# Patient Record
Sex: Female | Born: 1970 | Race: White | Hispanic: No | Marital: Single | Smoking: Current every day smoker
Health system: Southern US, Community
[De-identification: ages and names within clinical notes are randomized; demographics above are authoritative.]

## PROBLEM LIST (undated history)

## (undated) DIAGNOSIS — F209 Schizophrenia, unspecified: Secondary | ICD-10-CM

---

## 2013-12-11 ENCOUNTER — Encounter (HOSPITAL_COMMUNITY): Payer: Self-pay | Admitting: Emergency Medicine

## 2013-12-11 ENCOUNTER — Emergency Department (HOSPITAL_COMMUNITY)
Admission: EM | Admit: 2013-12-11 | Discharge: 2013-12-11 | Disposition: A | Discharge status: Home / Self Care | Payer: Medicaid Other | Attending: Emergency Medicine | Admitting: Emergency Medicine

## 2013-12-11 ENCOUNTER — Emergency Department (HOSPITAL_COMMUNITY): Payer: Medicaid Other

## 2013-12-11 DIAGNOSIS — R319 Hematuria, unspecified: Secondary | ICD-10-CM | POA: Insufficient documentation

## 2013-12-11 DIAGNOSIS — F209 Schizophrenia, unspecified: Secondary | ICD-10-CM | POA: Insufficient documentation

## 2013-12-11 HISTORY — DX: Schizophrenia, unspecified: F20.9

## 2013-12-11 LAB — COMPREHENSIVE METABOLIC PANEL
ALT: 6 U/L (ref 0–35)
AST: 10 U/L (ref 0–37)
Albumin: 3.8 g/dL (ref 3.5–5.2)
Alkaline Phosphatase: 53 U/L (ref 39–117)
BILIRUBIN TOTAL: 0.5 mg/dL (ref 0.3–1.2)
BUN: 9 mg/dL (ref 6–23)
CO2: 24 meq/L (ref 19–32)
Calcium: 9.2 mg/dL (ref 8.4–10.5)
Chloride: 98 mEq/L (ref 96–112)
Creatinine, Ser: 0.71 mg/dL (ref 0.50–1.10)
GFR calc Af Amer: >90 mL/min (ref >90)
GLUCOSE: 106 mg/dL — AB (ref 70–99)
Potassium: 4.3 mEq/L (ref 3.7–5.3)
SODIUM: 134 meq/L — AB (ref 137–147)
Total Protein: 7.5 g/dL (ref 6.0–8.3)

## 2013-12-11 LAB — URINE MICROSCOPIC-ADD ON

## 2013-12-11 LAB — RAPID URINE DRUG SCREEN, HOSP PERFORMED
Amphetamines: NOT DETECTED
Barbiturates: NOT DETECTED
Benzodiazepines: NOT DETECTED
Cocaine: NOT DETECTED
Opiates: NOT DETECTED
TETRAHYDROCANNABINOL: NOT DETECTED

## 2013-12-11 LAB — CBC
HCT: 41.3 % (ref 36.0–46.0)
HEMOGLOBIN: 14.3 g/dL (ref 12.0–15.0)
MCH: 31.1 pg (ref 26.0–34.0)
MCHC: 34.6 g/dL (ref 30.0–36.0)
MCV: 89.8 fL (ref 78.0–100.0)
Platelets: 260 10*3/uL (ref 150–400)
RBC: 4.6 MIL/uL (ref 3.87–5.11)
RDW: 12.4 % (ref 11.5–15.5)
WBC: 9.1 10*3/uL (ref 4.0–10.5)

## 2013-12-11 LAB — URINALYSIS, ROUTINE W REFLEX MICROSCOPIC
Bilirubin Urine: NEGATIVE
Glucose, UA: NEGATIVE mg/dL
Ketones, ur: NEGATIVE mg/dL
LEUKOCYTES UA: NEGATIVE
NITRITE: NEGATIVE
PROTEIN: NEGATIVE mg/dL
SPECIFIC GRAVITY, URINE: 1.021 (ref 1.005–1.030)
UROBILINOGEN UA: 1 mg/dL (ref 0.0–1.0)
pH: 5.5 (ref 5.0–8.0)

## 2013-12-11 LAB — SALICYLATE LEVEL: Salicylate Lvl: <2 mg/dL — ABNORMAL LOW (ref 2.8–20.0)

## 2013-12-11 LAB — ETHANOL: Alcohol, Ethyl (B): <11 mg/dL (ref 0–11)

## 2013-12-11 LAB — ACETAMINOPHEN LEVEL: Acetaminophen (Tylenol), Serum: <15 ug/mL (ref 10–30)

## 2013-12-11 NOTE — Progress Notes (Signed)
Patient cleared by [facility] return to facility. Discharge paperwork sent by transporters.

## 2013-12-11 NOTE — ED Notes (Signed)
Patient refused to let me get EKG

## 2013-12-11 NOTE — ED Notes (Signed)
Pt was sent over from [facility] for medical clearnce pt is IVC and is refusing treatment. Pt has a hx of schizo and not taking medication. Pt said for staff not to touch her or take her blood. Explained to pt that we will need to obtain her blood.

## 2013-12-11 NOTE — Progress Notes (Signed)
All documents required for [facility] to [facility] to [facility] have been faxed to facility. RN awaiting return phone call.

## 2013-12-11 NOTE — ED Provider Notes (Signed)
CSN: [id number]     Arrival date & time 12/11/13  1754 History   First MD Initiated Contact with Patient 12/11/13 1813     Chief Complaint  Patient presents with  . Medical Clearance   (Consider location/radiation/quality/duration/timing/severity/associated sxs/prior Treatment) HPI Comments: 43 year old female with a history of schizophrenia presents from [facility] for medical clearance. The patient was IVC there is his exhibiting psychotic behavior with delusions and disorganized speech. Is also concerned that she's not able to care for self after the psychiatrist talk to the mother. The patient here is refusing to allow an exam and does not cooperate with questioning. She states she's not know why she is here. The report states that she is needing medical clearance push to be treated aggressively for her schizophrenia.   Past Medical History  Diagnosis Date  . Schizophrenia     only known    No past surgical history on file. No family history on file. History  Substance Use Topics  . Smoking status: Not on file  . Smokeless tobacco: Not on file  . Alcohol Use: Not on file   OB History   Grav Para Term Preterm Abortions TAB SAB Ect Mult Living                 Review of Systems  Unable to perform ROS: Psychiatric disorder    Allergies  Review of patient's allergies indicates not on file.  Home Medications  No current outpatient prescriptions on file. BP 121/79  Pulse 73  Temp(Src) 98.5 F (36.9 C) (Oral)  Resp 18  SpO2 99% Physical Exam  Nursing note and vitals reviewed. Constitutional: She appears cachectic.  Odor noted on patient  HENT:  Head: Normocephalic and atraumatic.  Right Ear: External ear normal.  Left Ear: External ear normal.  Nose: Nose normal.  Eyes: Right eye exhibits no discharge. Left eye exhibits no discharge.  Cardiovascular: Normal rate.   Pulmonary/Chest: Effort normal.  Abdominal: She exhibits no distension.  Neurological: She is alert.   Skin: Skin is warm and dry. No rash noted.    ED Course  Procedures (including critical care time) Labs Review Labs Reviewed  COMPREHENSIVE METABOLIC PANEL - Abnormal; Notable for the following:    Sodium 134 (*)    Glucose, Bld 106 (*)    All other components within normal limits  SALICYLATE LEVEL - Abnormal; Notable for the following:    Salicylate Lvl <2.0 (*)    All other components within normal limits  URINALYSIS, ROUTINE W REFLEX MICROSCOPIC - Abnormal; Notable for the following:    APPearance CLOUDY (*)    Hgb urine dipstick LARGE (*)    All other components within normal limits  ACETAMINOPHEN LEVEL  CBC  ETHANOL  URINE RAPID DRUG SCREEN (HOSP PERFORMED)  URINE MICROSCOPIC-ADD ON   Imaging Review Dg Chest Portable 1 View  12/11/2013   CLINICAL DATA:  Schizophrenia.  Medical clearance.  EXAM: PORTABLE CHEST - 1 VIEW  COMPARISON:  None.  FINDINGS: Cardiac silhouette normal and mediastinal contours unremarkable for the AP portable technique. Pulmonary parenchyma clear. Bronchovascular markings normal. Pulmonary vascularity normal. No pneumothorax. No visible pleural effusions.  IMPRESSION: No acute cardiopulmonary disease.   Electronically Signed   By: Hulan Saas M.D.   On: 12/11/2013 19:07    EKG Interpretation    Date/Time:  Tuesday December 11 2013 19:22:33 EST Ventricular Rate:  62 PR Interval:  89 QRS Duration: 91 QT Interval:  434 QTC Calculation: 441 R Axis:  86 Text Interpretation:  Sinus rhythm Short PR interval Low voltage, precordial leads No old tracing to compare Confirmed by Chiyeko Ferre  MD, Kynnedi Zweig ([id number]) on 12/11/2013 7:29:27 PM            MDM   1. Schizophrenia   2. Hematuria    Patient is thin and appears chronically ill but no acute findings on workup today. Her chronic issues likely stem from poor self maintenance from chronic psychiatric disease. No acute issues now, feel she is stable to be sent back to [facility].      Audree CamelScott T Peyton Spengler, MD 12/12/13 [phone]

## 2013-12-11 NOTE — Discharge Instructions (Signed)
Hematuria, Adult Hematuria is blood in your urine. It can be caused by a bladder infection, kidney infection, prostate infection, kidney stone, or cancer of your urinary tract. Infections can usually be treated with medicine, and a kidney stone usually will pass through your urine. If neither of these is the cause of your hematuria, further workup to find out the reason may be needed. It is very important that you tell your health care provider about any blood you see in your urine, even if the blood stops without treatment or happens without causing pain. Blood in your urine that happens and then stops and then happens again can be a symptom of a very serious condition. Also, pain is not a symptom in the initial stages of many urinary cancers. HOME CARE INSTRUCTIONS   Drink lots of fluid, 3 4 quarts a day. If you have been diagnosed with an infection, cranberry juice is especially recommended, in addition to large amounts of water.  Avoid caffeine, tea, and carbonated beverages, because they tend to irritate the bladder.  Avoid alcohol because it may irritate the prostate.  Only take over-the-counter or prescription medicines for pain, discomfort, or fever as directed by your health care provider.  If you have been diagnosed with a kidney stone, follow your health care provider's instructions regarding straining your urine to catch the stone.  Empty your bladder often. Avoid holding urine for long periods of time.  After a bowel movement, women should cleanse front to back. Use each tissue only once.  Empty your bladder before and after sexual intercourse if you are a female. SEEK MEDICAL CARE IF: You develop back pain, fever, a feeling of sickness in your stomach (nausea), or vomiting or if your symptoms are not better in 3 days. Return sooner if you are getting worse. SEEK IMMEDIATE MEDICAL CARE IF:   You have a persistent fever, with a temperature of 101.20F (38.8C) or greater.  You  develop severe vomiting and are unable to keep the medicine down.  You develop severe back or abdominal pain despite taking your medicines.  You begin passing a large amount of blood or clots in your urine.  You feel extremely weak or faint, or you pass out. MAKE SURE YOU:   Understand these instructions.  Will watch your condition.  Will get help right away if you are not doing well or get worse. Document Released: 11/15/2005 Document Revised: 09/05/2013 Document Reviewed: 07/16/2013 [facility] Patient Information 2014 [place].    Schizophrenia Schizophrenia is a mental illness. It may cause disturbed or disorganized thinking, speech, or behavior. People with schizophrenia have problems functioning in one or more areas of life: work, school, home, or relationships. People with schizophrenia are at increased risk for suicide, certain chronic physical illnesses, and unhealthy behaviors, such as smoking and drug use. People who have family members with schizophrenia are at higher risk of developing the illness. Schizophrenia affects men and women equally but usually appears at an earlier age (teenage or early adult years) in men.  SYMPTOMS The earliest symptoms are often subtle (prodrome) and may go unnoticed until the illness becomes more severe (first-break psychosis). Symptoms of schizophrenia may be continuous or may come and go in severity. Episodes often are triggered by major life events, such as family stress, college, [employer] service, marriage, pregnancy or child birth, divorce, or loss of a loved one. People with schizophrenia may see, hear, or feel things that do not exist (hallucinations). They may have false beliefs in  spite of obvious proof to the contrary (delusions). Sometimes speech is incoherent or behavior is odd or withdrawn.  DIAGNOSIS Schizophrenia is diagnosed through an assessment by your caregiver. Your caregiver will ask questions about your thoughts, behavior,  mood, and ability to function in daily life. Your caregiver may ask questions about your medical history and use of alcohol or drugs, including prescription medication. Your caregiver may also order blood tests and imaging exams. Certain medical conditions and substances can cause symptoms that resemble schizophrenia. Your caregiver may refer you to a mental health specialist for evaluation. There are three major criterion for a diagnosis of schizophrenia:  Two or more of the following five symptoms are present for a month or longer:  Delusions. Often the delusions are that you are being attacked, harassed, cheated, persecuted or conspired against (persecutory delusions).  Hallucinations.   Disorganized speech that does not make sense to others.  Grossly disorganized (confused or unfocused) behavior or extremely overactive or underactive motor activity (catatonia).  Negative symptoms such as bland or blunted emotions (flat affect), loss of will power (avolition), and withdrawal from social contacts (social isolation).  Level of functioning in one or more major areas of life (work, school, relationships, or self-care) is markedly below the level of functioning before the onset of illness.   There are continuous signs of illness (either mild symptoms or decreased level of functioning) for at least 6 months or longer. TREATMENT  Schizophrenia is a long-term illness. It is best controlled with continuous treatment rather than treatment only when symptoms occur. The following treatments are used to manage schizophrenia:  Medication Medication is the most effective and important form of treatment for schizophrenia. Antipsychotic medications are usually prescribed to help manage schizophrenia. Other types of medication may be added to relieve any symptoms that may occur despite the use of antipsychotic medications.  Counseling or talk therapy Individual, group, or family counseling may be helpful in  providing education, support, and guidance. Many people with schizophrenia also benefit from social skills and job skills (vocational) training. A combination of medication and counseling is best for managing the disorder over time. A procedure in which electricity is applied to the brain through the scalp (electroconvulsive therapy) may be used to treat catatonic schizophrenia or schizophrenia in people who cannot take or do not respond to medication and counseling. Document Released: 11/12/2000 Document Revised: 07/18/2013 Document Reviewed: 02/07/2013 [facility] Patient Information 2014 [place], [place].

## 2013-12-23 ENCOUNTER — Emergency Department (HOSPITAL_COMMUNITY)
Admission: EM | Admit: 2013-12-23 | Discharge: 2013-12-24 | Disposition: A | Discharge status: Other Acute Inpatient Hospital | Payer: Medicaid Other | Attending: Emergency Medicine | Admitting: Emergency Medicine

## 2013-12-23 ENCOUNTER — Encounter (HOSPITAL_COMMUNITY): Payer: Self-pay | Admitting: Emergency Medicine

## 2013-12-23 DIAGNOSIS — F919 Conduct disorder, unspecified: Secondary | ICD-10-CM | POA: Insufficient documentation

## 2013-12-23 DIAGNOSIS — F29 Unspecified psychosis not due to a substance or known physiological condition: Secondary | ICD-10-CM | POA: Insufficient documentation

## 2013-12-23 DIAGNOSIS — Z79899 Other long term (current) drug therapy: Secondary | ICD-10-CM | POA: Insufficient documentation

## 2013-12-23 DIAGNOSIS — F209 Schizophrenia, unspecified: Secondary | ICD-10-CM | POA: Insufficient documentation

## 2013-12-23 DIAGNOSIS — IMO0002 Reserved for concepts with insufficient information to code with codable children: Secondary | ICD-10-CM | POA: Insufficient documentation

## 2013-12-23 LAB — COMPREHENSIVE METABOLIC PANEL
ALT: 53 U/L — AB (ref 0–35)
AST: 24 U/L (ref 0–37)
Albumin: 3.6 g/dL (ref 3.5–5.2)
Alkaline Phosphatase: 46 U/L (ref 39–117)
BUN: 9 mg/dL (ref 6–23)
CALCIUM: 9 mg/dL (ref 8.4–10.5)
CO2: 24 meq/L (ref 19–32)
CREATININE: 0.72 mg/dL (ref 0.50–1.10)
Chloride: 102 mEq/L (ref 96–112)
GLUCOSE: 88 mg/dL (ref 70–99)
Potassium: 4.6 mEq/L (ref 3.7–5.3)
SODIUM: 140 meq/L (ref 137–147)
TOTAL PROTEIN: 7 g/dL (ref 6.0–8.3)
Total Bilirubin: <0.2 mg/dL — ABNORMAL LOW (ref 0.3–1.2)

## 2013-12-23 LAB — RAPID URINE DRUG SCREEN, HOSP PERFORMED
Amphetamines: NOT DETECTED
BARBITURATES: NOT DETECTED
Benzodiazepines: NOT DETECTED
Cocaine: NOT DETECTED
Opiates: NOT DETECTED
Tetrahydrocannabinol: NOT DETECTED

## 2013-12-23 LAB — CBC WITH DIFFERENTIAL/PLATELET
BASOS ABS: 0 10*3/uL (ref 0.0–0.1)
Basophils Relative: 0 % (ref 0–1)
EOS ABS: 0.1 10*3/uL (ref 0.0–0.7)
Eosinophils Relative: 1 % (ref 0–5)
HEMATOCRIT: 32.7 % — AB (ref 36.0–46.0)
Hemoglobin: 11.2 g/dL — ABNORMAL LOW (ref 12.0–15.0)
Lymphocytes Relative: 17 % (ref 12–46)
Lymphs Abs: 1.6 10*3/uL (ref 0.7–4.0)
MCH: 31.2 pg (ref 26.0–34.0)
MCHC: 34.3 g/dL (ref 30.0–36.0)
MCV: 91.1 fL (ref 78.0–100.0)
MONO ABS: 0.6 10*3/uL (ref 0.1–1.0)
Monocytes Relative: 7 % (ref 3–12)
Neutro Abs: 7 10*3/uL (ref 1.7–7.7)
Neutrophils Relative %: 75 % (ref 43–77)
PLATELETS: 203 10*3/uL (ref 150–400)
RBC: 3.59 MIL/uL — ABNORMAL LOW (ref 3.87–5.11)
RDW: 13.1 % (ref 11.5–15.5)
WBC: 9.3 10*3/uL (ref 4.0–10.5)

## 2013-12-23 LAB — URINALYSIS, ROUTINE W REFLEX MICROSCOPIC
Bilirubin Urine: NEGATIVE
Glucose, UA: NEGATIVE mg/dL
Ketones, ur: NEGATIVE mg/dL
NITRITE: NEGATIVE
PH: 5 (ref 5.0–8.0)
Protein, ur: NEGATIVE mg/dL
Specific Gravity, Urine: 1.01 (ref 1.005–1.030)
Urobilinogen, UA: 0.2 mg/dL (ref 0.0–1.0)

## 2013-12-23 LAB — URINE MICROSCOPIC-ADD ON

## 2013-12-23 LAB — ACETAMINOPHEN LEVEL

## 2013-12-23 LAB — ETHANOL: Alcohol, Ethyl (B): 15 mg/dL — ABNORMAL HIGH (ref 0–11)

## 2013-12-23 LAB — SALICYLATE LEVEL

## 2013-12-23 MED ORDER — FOLIC ACID 1 MG PO TABS
1.0000 mg | ORAL_TABLET | Freq: Every day | ORAL | Status: DC
  Administered 2013-12-23 – 2013-12-24 (×2): 1 mg via ORAL
  Filled 2013-12-23 (×2): qty 1

## 2013-12-23 MED ORDER — LORAZEPAM 1 MG PO TABS
1.0000 mg | ORAL_TABLET | Freq: Once | ORAL | Status: AC
  Administered 2013-12-23: 1 mg via ORAL
  Filled 2013-12-23: qty 2

## 2013-12-23 MED ORDER — LORAZEPAM 1 MG PO TABS: 1.0000 mg | ORAL_TABLET | Freq: Four times a day (QID) | ORAL | Status: DC | PRN

## 2013-12-23 MED ORDER — LORAZEPAM 1 MG PO TABS: 1.0000 mg | ORAL_TABLET | Freq: Three times a day (TID) | ORAL | Status: DC | PRN

## 2013-12-23 MED ORDER — VITAMIN B-1 100 MG PO TABS
100.0000 mg | ORAL_TABLET | Freq: Every day | ORAL | Status: DC
  Administered 2013-12-23 – 2013-12-24 (×2): 100 mg via ORAL
  Filled 2013-12-23 (×2): qty 1

## 2013-12-23 MED ORDER — HALOPERIDOL 5 MG PO TABS
5.0000 mg | ORAL_TABLET | Freq: Once | ORAL | Status: AC
  Administered 2013-12-23: 5 mg via ORAL
  Filled 2013-12-23: qty 1

## 2013-12-23 MED ORDER — LORAZEPAM 1 MG PO TABS: 0.0000 mg | ORAL_TABLET | Freq: Two times a day (BID) | ORAL | Status: DC

## 2013-12-23 MED ORDER — IBUPROFEN 200 MG PO TABS: 600.0000 mg | ORAL_TABLET | Freq: Three times a day (TID) | ORAL | Status: DC | PRN

## 2013-12-23 MED ORDER — LORAZEPAM 2 MG/ML IJ SOLN: 1.0000 mg | Freq: Four times a day (QID) | INTRAMUSCULAR | Status: DC | PRN

## 2013-12-23 MED ORDER — THIAMINE HCL 100 MG/ML IJ SOLN: 100.0000 mg | Freq: Every day | INTRAMUSCULAR | Status: DC

## 2013-12-23 MED ORDER — ACETAMINOPHEN 325 MG PO TABS: 650.0000 mg | ORAL_TABLET | ORAL | Status: DC | PRN

## 2013-12-23 MED ORDER — LORAZEPAM 1 MG PO TABS
0.0000 mg | ORAL_TABLET | Freq: Four times a day (QID) | ORAL | Status: DC
Start: 2013-12-23 — End: 2013-12-24

## 2013-12-23 MED ORDER — ADULT MULTIVITAMIN W/MINERALS CH
1.0000 | ORAL_TABLET | Freq: Every day | ORAL | Status: DC
  Administered 2013-12-23 – 2013-12-24 (×2): 1 via ORAL
  Filled 2013-12-23 (×2): qty 1

## 2013-12-23 NOTE — ED Notes (Signed)
Locker # 5 in security total of $90.01 locked, key and pick up paper on chart

## 2013-12-23 NOTE — BH Assessment (Signed)
Notified in shift report of tele-assessment request. Spoke to Gloris Hamr.David Yao, MD who said Pt has schizophrenia and is psychotic with paranoid delusions. Pt's mother petitioned for IVC. Tele-assessment will be initiated.  Harlin RainFord Ellis Ria CommentWarrick Jr, LPC, [facility] Triage Specialist

## 2013-12-23 NOTE — ED Provider Notes (Signed)
CSN: [id number]     Arrival date & time 12/23/13  1638 History   First MD Initiated Contact with Patient 12/23/13 1653     Chief Complaint  Patient presents with  . Medical Clearance   (Consider location/radiation/quality/duration/timing/severity/associated sxs/prior Treatment) The history is provided by the patient.  [name] [name] is a 43 y.o. female hx of schizophrenia here with agitation, hallucinations. She has been IVC by mother. She is from home. She states that her fingerprints has been used for [employer] and creating buses. Also states that FBI is after her. She said that she petitioned president Bush but somehow didn't get passed on to [employer]. As per the IVC paperwork, patient has not been sleeping for the last several nights and has been drinking alcohol. She also has told her mother that she is living in [place].   Level V caveat- AMS    Past Medical History  Diagnosis Date  . Schizophrenia     only known    History reviewed. No pertinent past surgical history. No family history on file. History  Substance Use Topics  . Smoking status: Current Every Day Smoker -- 1.00 packs/day    Types: Cigarettes  . Smokeless tobacco: Not on file  . Alcohol Use: Yes   OB History   Grav Para Term Preterm Abortions TAB SAB Ect Mult Living                 Review of Systems  Psychiatric/Behavioral: Positive for hallucinations, behavioral problems and agitation.  All other systems reviewed and are negative.    Allergies  Sulfa antibiotics  Home Medications   Current Outpatient Rx  Name  Route  Sig  Dispense  Refill  . acetaminophen (TYLENOL) 500 MG tablet   Oral   Take 1,000 mg by mouth every 6 (six) hours as needed for mild pain.          BP 91/46  Pulse 94  Temp(Src) 99.1 F (37.3 C) (Oral)  Resp 16  SpO2 98%  LMP 12/21/2013 Physical Exam  Nursing note and vitals reviewed. Constitutional:  Disheveled, agitated, hallucinating   HENT:  Head: Normocephalic.   Mouth/Throat: Oropharynx is clear and moist.  Eyes: Conjunctivae are normal. Pupils are equal, round, and reactive to light.  Neck: Normal range of motion. Neck supple.  Cardiovascular: Regular rhythm and normal heart sounds.   Tachycardic   Pulmonary/Chest: Effort normal and breath sounds normal. No respiratory distress. She has no wheezes. She has no rales.  Abdominal: Soft. Bowel sounds are normal. She exhibits no distension. There is no tenderness. There is no rebound.  Musculoskeletal: Normal range of motion. She exhibits no edema and no tenderness.  Neurological: She is alert. No cranial nerve deficit. Coordination normal.  Skin: Skin is warm and dry.  Psychiatric:  Hallucinating. Poor judgement     ED Course  Procedures (including critical care time) Labs Review Labs Reviewed  CBC WITH DIFFERENTIAL - Abnormal; Notable for the following:    RBC 3.59 (*)    Hemoglobin 11.2 (*)    HCT 32.7 (*)    All other components within normal limits  COMPREHENSIVE METABOLIC PANEL - Abnormal; Notable for the following:    ALT 53 (*)    Total Bilirubin <0.2 (*)    All other components within normal limits  ETHANOL - Abnormal; Notable for the following:    Alcohol, Ethyl (B) 15 (*)    All other components within normal limits  SALICYLATE LEVEL - Abnormal; Notable  for the following:    Salicylate Lvl <2.0 (*)    All other components within normal limits  URINALYSIS, ROUTINE W REFLEX MICROSCOPIC - Abnormal; Notable for the following:    Hgb urine dipstick SMALL (*)    Leukocytes, UA SMALL (*)    All other components within normal limits  URINE MICROSCOPIC-ADD ON - Abnormal; Notable for the following:    Squamous Epithelial / LPF FEW (*)    All other components within normal limits  ACETAMINOPHEN LEVEL  URINE RAPID DRUG SCREEN (HOSP PERFORMED)   Imaging Review No results found.  EKG Interpretation   None       MDM  No diagnosis found. [name] [name] is a 43 y.o. female here  with hallucination. Likely mania vs schizophrenia. Will check psych clearance labs. Will get TTS consult.   7:43 PM Labs unremarkable. ETOH 15. Medically cleared. Psych will evaluate.    [name] Canalavid H Davinia Riccardi, MD 12/23/13 [phone]
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
## 2013-12-23 NOTE — ED Notes (Signed)
Bed: WA17 Expected date:  Expected time:  Means of arrival:  Comments: ETOH 

## 2013-12-23 NOTE — ED Notes (Signed)
Pt in via [employer], pt smells of ETOH, rambling that she needs to get her "fingerprints proven to show [employer] so that she can get her payout." Pt also rambling about FBI, [employer], [place], [employer] buses. Pt appears agitated but cooperative. Dr Silverio LayYao at bedside.

## 2013-12-23 NOTE — ED Notes (Addendum)
Pt brought in by [employer] with IVC papers, pt intoxicated with ETOH. Pt has not been compliant with meds and not sleeping. Pt rmbling about FBI, Obama and various other topics. Pts mothers boyfriend called [employer]. Mother took out IVC papers.

## 2013-12-23 NOTE — ED Notes (Signed)
Pt resting quietly at this time on stretcher with even , unlabored resp.

## 2013-12-23 NOTE — ED Notes (Signed)
Pt has one belonging bag underneath nursing station, bag has pts purse inside. Money was sent to safe.

## 2013-12-23 NOTE — BH Assessment (Signed)
Attempted to initiate tele-assessment. Pt is asleep and nursing staff is with another patient and unable to assist. Left message for RN to call TTS.  Harlin RainFord Ellis Ria CommentWarrick Jr, LPC, [facility] Triage Specialist

## 2013-12-23 NOTE — ED Notes (Signed)
Bedside report received from previous RN, Susan. 

## 2013-12-23 NOTE — ED Notes (Signed)
Pt with hx of Schizophrenia, transferred from main ed, presents with complaint of psychotic behavior, paranoid.  Denies SI or HI, no AV hallucinations.  Denies drug or alcohol abuse.  Pt under IVC.  Pt calm & cooperative at present.

## 2013-12-24 ENCOUNTER — Encounter (HOSPITAL_COMMUNITY): Payer: Self-pay | Admitting: Psychiatry

## 2013-12-24 ENCOUNTER — Inpatient Hospital Stay (HOSPITAL_COMMUNITY): Admission: AD | Admit: 2013-12-24 | Payer: Self-pay | Source: Intra-hospital | Admitting: Psychiatry

## 2013-12-24 ENCOUNTER — Inpatient Hospital Stay (HOSPITAL_COMMUNITY)
Admission: AD | Admit: 2013-12-24 | Discharge: 2014-01-02 | DRG: 885 | Disposition: A | Discharge status: Home / Self Care | Payer: Medicaid Other | Source: Intra-hospital | Attending: Psychiatry | Admitting: Psychiatry

## 2013-12-24 ENCOUNTER — Encounter (HOSPITAL_COMMUNITY): Payer: Self-pay | Admitting: *Deleted

## 2013-12-24 DIAGNOSIS — F17200 Nicotine dependence, unspecified, uncomplicated: Secondary | ICD-10-CM | POA: Diagnosis present

## 2013-12-24 DIAGNOSIS — F200 Paranoid schizophrenia: Principal | ICD-10-CM | POA: Diagnosis present

## 2013-12-24 DIAGNOSIS — Z5987 Material hardship due to limited financial resources, not elsewhere classified: Secondary | ICD-10-CM

## 2013-12-24 DIAGNOSIS — F4310 Post-traumatic stress disorder, unspecified: Secondary | ICD-10-CM | POA: Diagnosis present

## 2013-12-24 DIAGNOSIS — Z598 Other problems related to housing and economic circumstances: Secondary | ICD-10-CM

## 2013-12-24 DIAGNOSIS — F1010 Alcohol abuse, uncomplicated: Secondary | ICD-10-CM | POA: Diagnosis present

## 2013-12-24 DIAGNOSIS — Z9119 Patient's noncompliance with other medical treatment and regimen: Secondary | ICD-10-CM

## 2013-12-24 DIAGNOSIS — F1910 Other psychoactive substance abuse, uncomplicated: Secondary | ICD-10-CM

## 2013-12-24 DIAGNOSIS — F209 Schizophrenia, unspecified: Secondary | ICD-10-CM

## 2013-12-24 DIAGNOSIS — Z91199 Patient's noncompliance with other medical treatment and regimen due to unspecified reason: Secondary | ICD-10-CM

## 2013-12-24 MED ORDER — THIAMINE HCL 100 MG/ML IJ SOLN: 100.0000 mg | Freq: Once | INTRAMUSCULAR | Status: DC

## 2013-12-24 MED ORDER — CHLORDIAZEPOXIDE HCL 25 MG PO CAPS: 25.0000 mg | ORAL_CAPSULE | Freq: Every day | ORAL | Status: DC

## 2013-12-24 MED ORDER — OLANZAPINE 5 MG PO TBDP
5.0000 mg | ORAL_TABLET | Freq: Two times a day (BID) | ORAL | Status: DC
  Administered 2013-12-24: 5 mg via ORAL
  Filled 2013-12-24: qty 1

## 2013-12-24 MED ORDER — ACETAMINOPHEN 325 MG PO TABS
650.0000 mg | ORAL_TABLET | Freq: Four times a day (QID) | ORAL | Status: DC | PRN
  Administered 2013-12-31 – 2014-01-01 (×5): 650 mg via ORAL
  Filled 2013-12-24 (×5): qty 2

## 2013-12-24 MED ORDER — VITAMIN B-1 100 MG PO TABS
100.0000 mg | ORAL_TABLET | Freq: Every day | ORAL | Status: DC
  Administered 2013-12-25 – 2014-01-02 (×8): 100 mg via ORAL
  Filled 2013-12-24 (×12): qty 1

## 2013-12-24 MED ORDER — ONDANSETRON 4 MG PO TBDP: 4.0000 mg | ORAL_TABLET | Freq: Four times a day (QID) | ORAL | Status: AC | PRN

## 2013-12-24 MED ORDER — ADULT MULTIVITAMIN W/MINERALS CH
1.0000 | ORAL_TABLET | Freq: Every day | ORAL | Status: DC
  Administered 2013-12-25 – 2014-01-02 (×9): 1 via ORAL
  Filled 2013-12-24 (×12): qty 1

## 2013-12-24 MED ORDER — CHLORDIAZEPOXIDE HCL 25 MG PO CAPS: 25.0000 mg | ORAL_CAPSULE | Freq: Four times a day (QID) | ORAL | Status: AC | PRN

## 2013-12-24 MED ORDER — ALUM & MAG HYDROXIDE-SIMETH 200-200-20 MG/5ML PO SUSP: 30.0000 mL | ORAL | Status: DC | PRN

## 2013-12-24 MED ORDER — TRAZODONE HCL 50 MG PO TABS
50.0000 mg | ORAL_TABLET | Freq: Every evening | ORAL | Status: DC | PRN
  Filled 2013-12-24 (×4): qty 1

## 2013-12-24 MED ORDER — MAGNESIUM HYDROXIDE 400 MG/5ML PO SUSP: 30.0000 mL | Freq: Every day | ORAL | Status: DC | PRN

## 2013-12-24 MED ORDER — HYDROXYZINE HCL 25 MG PO TABS: 25.0000 mg | ORAL_TABLET | Freq: Four times a day (QID) | ORAL | Status: DC | PRN

## 2013-12-24 MED ORDER — LOPERAMIDE HCL 2 MG PO CAPS: 2.0000 mg | ORAL_CAPSULE | ORAL | Status: AC | PRN

## 2013-12-24 MED ORDER — CHLORDIAZEPOXIDE HCL 25 MG PO CAPS: 25.0000 mg | ORAL_CAPSULE | ORAL | Status: DC

## 2013-12-24 MED ORDER — HYDROXYZINE HCL 25 MG PO TABS
25.0000 mg | ORAL_TABLET | Freq: Four times a day (QID) | ORAL | Status: DC | PRN
Start: 2013-12-24 — End: 2013-12-25

## 2013-12-24 MED ORDER — CHLORDIAZEPOXIDE HCL 25 MG PO CAPS
25.0000 mg | ORAL_CAPSULE | Freq: Four times a day (QID) | ORAL | Status: DC
  Administered 2013-12-25: 25 mg via ORAL
  Filled 2013-12-24: qty 1

## 2013-12-24 MED ORDER — CHLORDIAZEPOXIDE HCL 25 MG PO CAPS: 25.0000 mg | ORAL_CAPSULE | Freq: Three times a day (TID) | ORAL | Status: DC

## 2013-12-24 MED ORDER — OLANZAPINE 5 MG PO TBDP
5.0000 mg | ORAL_TABLET | Freq: Two times a day (BID) | ORAL | Status: DC
  Administered 2013-12-24 – 2013-12-25 (×2): 5 mg via ORAL
  Filled 2013-12-24 (×6): qty 1

## 2013-12-24 NOTE — BH Assessment (Signed)
Per Luwanda Daniels, AC at [facility], adult unit is at capacity. Contacted the following facilities for placement:  [facility]: At capacity [facility]: At capacity [facility]: At capacity [facility]: At capacity [facility]: At capacity [facility]: At capacity [facility]: At capacity [facility]: At capacity [facility]: At capacity [facility]: At capacity [facility]: At capacity [facility]: At capacity  [facility]: Bed available. Faxed clinical information. [facility]: Bed available. Faxed clinical information.   Lynelle Weiler Ellis Aaronjames Kelsay Jr, LPC, [facility] Triage Specialist  

## 2013-12-24 NOTE — Progress Notes (Signed)
Patient ID: [name], female   DOB: 08/01/1971, 43 y.o.   MRN: [id number] Pt. Is a 43 yo female admitted involuntarily due to psychotic behaviors. Pt. Has a hx of paranoid schizophrenia. Per reports pt. [place], [place]. Pt. Denies psychiatric help but history reveals at some point she was being followed by an ACT team and pt. Admitted that she did have a [facility]. Pt. Later reports at some point she went to see the doctor he gave her Haldol and Cogentin. "He told me the medication was to help stabilize me and I was given the medication to learn a lesson and the lesson was to stay out of the cold or you'll get sick and about living in a bad area."  Pt. Lives with her mother, has nochildren, never married. Pt. Is anxious during this admission, restless and suspicious. Pt. Able to sit still to complete admission. No history of medical problems noted in previous documentation. Staff offered pt. Food/drink, pt. Was oriented to the unit/room. Staff will continue to monitor q15 min for safety.

## 2013-12-24 NOTE — Consult Note (Signed)
Patient is psychotic, needs inpatient admission for stabilization

## 2013-12-24 NOTE — Progress Notes (Signed)
   CARE MANAGEMENT ED NOTE 12/24/2013  Patient:  [name] [name]   Account Number:  [id number]  Date Initiated:  12/24/2013  Documentation initiated by:  Edd ArbourGIBBS,Sadhana Frater  Subjective/Objective Assessment:   43 yr old medicaid of  pt confirms no pcp unable to find pcp on medicaid card scanned in EPIC     Subjective/Objective Assessment Detail:   Pt states her MH provider is "[facility]"     Action/Plan:   EPIC updated   Action/Plan Detail:   Anticipated DC Date:       Status Recommendation to Physician:   Result of Recommendation:    Other ED Services  Consult Working Psychologist, educationallan    DC Planning Services  Other  Outpatient Services - Pt will follow up  PCP issues    Choice offered to / List presented to:            Status of service:  Completed, signed off  ED Comments:   ED Comments Detail:

## 2013-12-24 NOTE — [facility] Counselor (Signed)
[facility] Tanna SavoyEric [facility], pt has been accepted to bed 407-2. Support paperwork will need to be completed.   Evette Cristalaroline Paige Icelyn Navarrete, [place] Assessment Counselor
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
## 2013-12-24 NOTE — Tx Team (Signed)
Initial Interdisciplinary Treatment Plan  PATIENT STRENGTHS: (choose at least two) Communication skills General fund of knowledge Supportive family/friends  PATIENT STRESSORS: Medication change or noncompliance   PROBLEM LIST: Problem List/Patient Goals Date to be addressed Date deferred Reason deferred Estimated date of resolution  Noncompliant with medications 12-24-13     Psychosis (delusional) 12-24-13                                                DISCHARGE CRITERIA:  Improved stabilization in mood, thinking, and/or behavior Safe-care adequate arrangements made Verbal commitment to aftercare and medication compliance  PRELIMINARY DISCHARGE PLAN: Attend aftercare/continuing care group Outpatient therapy Participate in family therapy Return to previous living arrangement  PATIENT/FAMIILY INVOLVEMENT: This treatment plan has been presented to and reviewed with the patient, [name] [name], and/or family member.  The patient and family have been given the opportunity to ask questions and make suggestions.  [name] [name], [name] [name] 12/24/2013, 10:44 PM

## 2013-12-25 ENCOUNTER — Encounter (HOSPITAL_COMMUNITY): Payer: Self-pay | Admitting: Psychiatry

## 2013-12-25 MED ORDER — TRAZODONE HCL 100 MG PO TABS
100.0000 mg | ORAL_TABLET | Freq: Every day | ORAL | Status: DC
  Administered 2013-12-26 – 2013-12-31 (×5): 100 mg via ORAL
  Filled 2013-12-25 (×9): qty 1

## 2013-12-25 MED ORDER — TRIHEXYPHENIDYL HCL 5 MG PO TABS
5.0000 mg | ORAL_TABLET | Freq: Two times a day (BID) | ORAL | Status: DC
  Administered 2013-12-25 – 2013-12-28 (×6): 5 mg via ORAL
  Filled 2013-12-25 (×9): qty 1

## 2013-12-25 MED ORDER — HALOPERIDOL 5 MG PO TABS
5.0000 mg | ORAL_TABLET | Freq: Two times a day (BID) | ORAL | Status: DC
  Administered 2013-12-25 – 2013-12-26 (×3): 5 mg via ORAL
  Filled 2013-12-25 (×5): qty 1

## 2013-12-25 MED ORDER — OLANZAPINE 10 MG PO TBDP
10.0000 mg | ORAL_TABLET | Freq: Three times a day (TID) | ORAL | Status: DC | PRN
  Administered 2013-12-28 – 2013-12-30 (×2): 10 mg via ORAL
  Filled 2013-12-25 (×2): qty 1

## 2013-12-25 NOTE — Progress Notes (Signed)
D: Pt presents with rapid, pressured speech, flight of ideas and tangential thoughts. Writer found it difficult to process information pt was given d/t pt rambling and jumping from one topic to another. Pt has poor insight. Pt is fixated on her finger prints and how she is a billionaire because of her finger prints.  Pt presents anxious, fidgety, observed pacing the hallway and constantly talking to herself. Pt denies SI/HI/AVH. A: Medications administered as ordered per MD. Verbal support given. Pt encouraged to attend groups. 15 minute checks performed for safety. R: Pt safety maintained.

## 2013-12-25 NOTE — [facility] Group Notes (Signed)
[facility] LCSW Group Therapy  12/25/2013 , 12:52 PM   Type of Therapy:  Group Therapy  Participation Level: None  Participation Quality:  Preoccupied  Affect:  Appropriate  Cognitive:  Alert  Insight: None  Engagement in Therapy: None  Modes of Intervention:  Discussion, Exploration and Socialization  Summary of Progress/Problems: Today's group focused on the term Diagnosis.  Participants were asked to define the term, and then pronounce whether it is a negative, positive or neutral term.  [name] wandered in and out of group several times, spending more time out than in.  When in group, she unsuccessfully attempted to get liquid from the container, and stood in the middle fo the group, shifting her weight from one leg to the other.  Other pts did a nice job of ignoring her.  [name], [name] 12/25/2013 , 12:52 PM

## 2013-12-25 NOTE — H&P (Signed)
Psychiatric Admission Assessment Adult  Patient Identification:  [name] [name] Date of Evaluation:  12/25/2013 Chief Complaint:  SCHIZOPHRENIA History of Present Illness::  [name] [name] is a 43 year old female who was brought to [facility] under IVC initiated by the patient's mother. Due to her very disorganized thought processes the patient has been unable to provide any relevant information about her current psychiatric state. Her mother was contacted in the ED who reported that her and the patient recently moved [place]. [name]'s mother reports that patient has a long history of schizophrenia with multiple hospitalizations in [place]. The patient was recently at [facility] having been released from their facility four days ago. [name] [name] per mother's report has been uncooperative with follow up running out of [facility]'s office then having to be found with the help of police somewhere in downtown [place]. Patient has been noticed recently sitting in her bedroom talking to people who are not there. Patient is very disorganized during her admission assessment today stating "Somebody brought me here. I follow the Psychiatric Department. Not because I have any problems but they do and need to support me. My fingerprints could make me a billionaire." The patient constantly mentions the "the Psychiatric Department in [facility]." and makes frequent references to the [facility].  Patient is noted to be very restless, pacing the hallway and has been observed constantly talking to herself. Her chart was reviewed to gain the information necessary to complete her history and physical.   Elements:  Location:  Psychosis . Quality:  Worsening paranoia and delusions . Severity:  Severe . Timing:  Over the last four days . Duration:  Long history of schizophrenia since 43 years of age. . Context:  Alcohol abuse, not taking psychotropic medications . Associated Signs/Synptoms: Depression Symptoms:  Denies   (Hypo) Manic Symptoms:   Anxiety Symptoms:  Denies  Psychotic Symptoms:  Delusions, Paranoia, PTSD Symptoms: Negative Musculoskeletal: Patient is noted to have a normal gait and station when ambulating around the unit.  Psychiatric Specialty Exam: Physical Exam  Constitutional:  Physical exam findings reviewed from the [facility] and I agree with findings with no noted exceptions.     Review of Systems  Constitutional: Negative.   HENT: Negative.   Eyes: Negative.   Respiratory: Negative.   Cardiovascular: Negative.   Gastrointestinal: Negative.   Genitourinary: Negative.   Musculoskeletal: Negative.   Skin: Negative.   Neurological: Negative.   Endo/Heme/Allergies: Negative.   Psychiatric/Behavioral: Positive for substance abuse. Negative for depression, suicidal ideas and memory loss. The patient is nervous/anxious and has insomnia.     Blood pressure 108/67, pulse 96, temperature 98.2 F (36.8 C), temperature source Oral, resp. rate 18, height 5' 6.5" (1.689 m), weight 57.607 kg (127 lb), last menstrual period 12/21/2013.Body mass index is 20.19 kg/(m^2).  General Appearance: Disheveled  Eye Contact::  Good  Speech:  Pressured  Volume:  Increased  Mood:  Anxious  Affect:  Full Range  Thought Process:  Loose and Tangential  Orientation:  Other:  Patient only oriented to person at this time.   Thought Content:  Delusions, Ideas of Reference:   Paranoia Delusions, Paranoid Ideation and Rumination  Suicidal Thoughts:  No  Homicidal Thoughts:  No  Memory:  Immediate;   Poor Recent;   Poor Remote;   Poor  Judgement:  Impaired  Insight:  Lacking  Psychomotor Activity:  Increased and Restlessness  Concentration:  Poor  Recall:  Poor  Akathisia:  No  Handed:  Right  AIMS (if indicated):     Assets:  Communication Skills Desire for Improvement Physical Health Resilience  Sleep:  Number of Hours: 6.75  Language: Patient is able to name common objects despite her  disorganized thought processes. Fund of Knowledge: Patient is able to correctly name the President of the [place] She does not have good knowledge of current events in the media right now.   Past Psychiatric History:Yes  Diagnosis:Paranoid Schizophrenia   Hospitalizations:Recently at [facility], multiple in [place]  Outpatient Care:[facility]   Substance Abuse Care:Denies   Self-Mutilation: History of cutting   Suicidal Attempts:None   Violent Behaviors:None    Past Medical History:   Past Medical History  Diagnosis Date  . Schizophrenia     only known    None. Allergies:   Allergies  Allergen Reactions  . Sulfa Antibiotics Other (See Comments)    unknown   PTA Medications: Prescriptions prior to admission  Medication Sig Dispense Refill  . acetaminophen (TYLENOL) 500 MG tablet Take 1,000 mg by mouth every 6 (six) hours as needed for mild pain.        Previous Psychotropic Medications:  Medication/Dose  Haldol   Cogentin              Substance Abuse History in the last 12 months:  yes  Consequences of Substance Abuse: Patient has been drinking beer instead of taking medications with worsening of her mental health symptoms.   Family History: "I don't know"  Social History:  reports that she has been smoking Cigarettes.  She has been smoking about 1.00 pack per day. She does not have any smokeless tobacco history on file. She reports that she drinks alcohol. Her drug history is not on file. Additional Social History: History of alcohol / drug use?: Yes Withdrawal Symptoms: Agitation                    Current Place of Residence:  [place], [place] of Birth:  [place], [place]  Family Members: Marital Status:  Single Children:0  Sons:  Daughters: Relationships: Education:  "I went through the ninth grade."  Educational Problems/Performance: Religious Beliefs/Practices: History of Abuse (Emotional/Phsycial/Sexual) Occupational Experiences; Military  History:  None. Legal History: Hobbies/Interests:  Family History:  History reviewed. No pertinent family history.  Results for orders placed during the hospital encounter of 12/23/13 (from the past 72 hour(s))  URINALYSIS, ROUTINE W REFLEX MICROSCOPIC     Status: Abnormal   Collection Time    12/23/13  5:47 PM      Result Value Range   Color, Urine YELLOW  YELLOW   APPearance CLEAR  CLEAR   Specific Gravity, Urine 1.010  1.005 - 1.030   pH 5.0  5.0 - 8.0   Glucose, UA NEGATIVE  NEGATIVE mg/dL   Hgb urine dipstick SMALL (*) NEGATIVE   Bilirubin Urine NEGATIVE  NEGATIVE   Ketones, ur NEGATIVE  NEGATIVE mg/dL   Protein, ur NEGATIVE  NEGATIVE mg/dL   Urobilinogen, UA 0.2  0.0 - 1.0 mg/dL   Nitrite NEGATIVE  NEGATIVE   Leukocytes, UA SMALL (*) NEGATIVE  URINE RAPID DRUG SCREEN (HOSP PERFORMED)     Status: None   Collection Time    12/23/13  5:47 PM      Result Value Range   Opiates NONE DETECTED  NONE DETECTED   Cocaine NONE DETECTED  NONE DETECTED   Benzodiazepines NONE DETECTED  NONE DETECTED   Amphetamines NONE DETECTED  NONE DETECTED  Tetrahydrocannabinol NONE DETECTED  NONE DETECTED   Barbiturates NONE DETECTED  NONE DETECTED   Comment:            DRUG SCREEN FOR MEDICAL PURPOSES     ONLY.  IF CONFIRMATION IS NEEDED     FOR ANY PURPOSE, NOTIFY LAB     WITHIN 5 DAYS.                LOWEST DETECTABLE LIMITS     FOR URINE DRUG SCREEN     Drug Class       Cutoff (ng/mL)     Amphetamine      1000     Barbiturate      200     Benzodiazepine   956     Tricyclics       387     Opiates          300     Cocaine          300     THC              50  URINE MICROSCOPIC-ADD ON     Status: Abnormal   Collection Time    12/23/13  5:47 PM      Result Value Range   Squamous Epithelial / LPF FEW (*) RARE   WBC, UA 0-2  <3 WBC/hpf   RBC / HPF 0-2  <3 RBC/hpf   Bacteria, UA RARE  RARE  CBC WITH DIFFERENTIAL     Status: Abnormal   Collection Time    12/23/13  5:56 PM       Result Value Range   WBC 9.3  4.0 - 10.5 K/uL   RBC 3.59 (*) 3.87 - 5.11 MIL/uL   Hemoglobin 11.2 (*) 12.0 - 15.0 g/dL   HCT 32.7 (*) 36.0 - 46.0 %   MCV 91.1  78.0 - 100.0 fL   MCH 31.2  26.0 - 34.0 pg   MCHC 34.3  30.0 - 36.0 g/dL   RDW 13.1  11.5 - 15.5 %   Platelets 203  150 - 400 K/uL   Neutrophils Relative % 75  43 - 77 %   Neutro Abs 7.0  1.7 - 7.7 K/uL   Lymphocytes Relative 17  12 - 46 %   Lymphs Abs 1.6  0.7 - 4.0 K/uL   Monocytes Relative 7  3 - 12 %   Monocytes Absolute 0.6  0.1 - 1.0 K/uL   Eosinophils Relative 1  0 - 5 %   Eosinophils Absolute 0.1  0.0 - 0.7 K/uL   Basophils Relative 0  0 - 1 %   Basophils Absolute 0.0  0.0 - 0.1 K/uL  COMPREHENSIVE METABOLIC PANEL     Status: Abnormal   Collection Time    12/23/13  5:56 PM      Result Value Range   Sodium 140  137 - 147 mEq/L   Potassium 4.6  3.7 - 5.3 mEq/L   Chloride 102  96 - 112 mEq/L   CO2 24  19 - 32 mEq/L   Glucose, Bld 88  70 - 99 mg/dL   BUN 9  6 - 23 mg/dL   Creatinine, Ser 0.72  0.50 - 1.10 mg/dL   Calcium 9.0  8.4 - 10.5 mg/dL   Total Protein 7.0  6.0 - 8.3 g/dL   Albumin 3.6  3.5 - 5.2 g/dL   AST 24  0 - 37 U/L   ALT 53 (*)  0 - 35 U/L   Alkaline Phosphatase 46  39 - 117 U/L   Total Bilirubin <0.2 (*) 0.3 - 1.2 mg/dL   Comment: RESULT REPEATED AND VERIFIED   GFR calc non Af Amer >90  >90 mL/min   GFR calc Af Amer >90  >90 mL/min   Comment: (NOTE)     The eGFR has been calculated using the CKD EPI equation.     This calculation has not been validated in all clinical situations.     eGFR's persistently <90 mL/min signify possible Chronic Kidney     Disease.  ETHANOL     Status: Abnormal   Collection Time    12/23/13  5:56 PM      Result Value Range   Alcohol, Ethyl (B) 15 (*) 0 - 11 mg/dL   Comment:            LOWEST DETECTABLE LIMIT FOR     SERUM ALCOHOL IS 11 mg/dL     FOR MEDICAL PURPOSES ONLY  SALICYLATE LEVEL     Status: Abnormal   Collection Time    12/23/13  5:56 PM      Result  Value Range   Salicylate Lvl <7.6 (*) 2.8 - 20.0 mg/dL  ACETAMINOPHEN LEVEL     Status: None   Collection Time    12/23/13  5:56 PM      Result Value Range   Acetaminophen (Tylenol), Serum <15.0  10 - 30 ug/mL   Comment:            THERAPEUTIC CONCENTRATIONS VARY     SIGNIFICANTLY. A RANGE OF 10-30     ug/mL MAY BE AN EFFECTIVE     CONCENTRATION FOR MANY PATIENTS.     HOWEVER, SOME ARE BEST TREATED     AT CONCENTRATIONS OUTSIDE THIS     RANGE.     ACETAMINOPHEN CONCENTRATIONS     >150 ug/mL AT 4 HOURS AFTER     INGESTION AND >50 ug/mL AT 12     HOURS AFTER INGESTION ARE     OFTEN ASSOCIATED WITH TOXIC     REACTIONS.   Psychological Evaluations:  Assessment:   DSM5:  Schizophrenia Disorders:  Schizophrenia (295.7) Obsessive-Compulsive Disorders:   Trauma-Stressor Disorders:   Substance/Addictive Disorders:  Alcohol Abuse  Depressive Disorders:    AXIS I:  Chronic Paranoid Schizophrenia AXIS II:  Deferred AXIS III:   Past Medical History  Diagnosis Date  . Schizophrenia     only known    AXIS IV:  economic problems, educational problems, occupational problems and other psychosocial or environmental problems AXIS V:  31-40 impairment in reality testing  Treatment Plan/Recommendations:   1. Admit for crisis management and stabilization. Estimated length of stay 5-7 days. 2. Medication management to reduce current symptoms to base line and improve the patient's level of functioning. Trazodone initiated to help improve sleep. 3. Develop treatment plan to decrease risk of relapse upon discharge of psychotic symptoms and the need for readmission. 5. Group therapy to facilitate development of healthy coping skills to use for psychosis.  6. Health care follow up as needed for medical problems.  7. Discharge plan to include therapy to help patient cope with stressor of chronic mental illness.  8. Call for Consult with Hospitalist for additional specialty patient services as  needed.   Treatment Plan Summary: Daily contact with patient to assess and evaluate symptoms and progress in treatment Medication management Current Medications:  Current Facility-Administered Medications  Medication Dose Route  Frequency Provider Last Rate Last Dose  . acetaminophen (TYLENOL) tablet 650 mg  650 mg Oral Q6H PRN Laverle Hobby, PA-C      . alum & mag hydroxide-simeth (MAALOX/MYLANTA) 200-200-20 MG/5ML suspension 30 mL  30 mL Oral Q4H PRN Laverle Hobby, PA-C      . chlordiazePOXIDE (LIBRIUM) capsule 25 mg  25 mg Oral Q6H PRN Laverle Hobby, PA-C      . haloperidol (HALDOL) tablet 5 mg  5 mg Oral BID Toneshia Coello      . loperamide (IMODIUM) capsule 2-4 mg  2-4 mg Oral PRN Laverle Hobby, PA-C      . magnesium hydroxide (MILK OF MAGNESIA) suspension 30 mL  30 mL Oral Daily PRN Laverle Hobby, PA-C      . multivitamin with minerals tablet 1 tablet  1 tablet Oral Daily Laverle Hobby, PA-C   1 tablet at 12/25/13 0734  . OLANZapine zydis (ZYPREXA) disintegrating tablet 10 mg  10 mg Oral Q8H PRN Derk Doubek      . ondansetron (ZOFRAN-ODT) disintegrating tablet 4 mg  4 mg Oral Q6H PRN Laverle Hobby, PA-C      . thiamine (B-1) injection 100 mg  100 mg Intramuscular Once [employer], PA-C      . thiamine (VITAMIN B-1) tablet 100 mg  100 mg Oral Daily Laverle Hobby, PA-C   100 mg at 12/25/13 0734  . traZODone (DESYREL) tablet 100 mg  100 mg Oral QHS Aerion Bagdasarian      . trihexyphenidyl (ARTANE) tablet 5 mg  5 mg Oral BID WC Khaleb Broz        Observation Level/Precautions:  15 minute checks  Laboratory:  CBC Chemistry Profile UDS UA Alcohol level 15.   Psychotherapy:  Individual and Group Therapy   Medications:  Haldol 5 mg BID for psychosis, Artane 5 mg BID for EPS prevention, Zyprexa Zydis 10 mg every eight hours prn agitation, Librium 25 mg every six hours prn alcohol withdrawal   Consultations: As needed  Discharge Concerns:  Safety and Stability    Estimated LOS: 5-7 days   Other:  Obtain collateral information from family    I certify that inpatient services furnished can reasonably be expected to improve the patient's condition.   Elmarie Shiley NP-C 1/27/201511:40 AM   Patient seen, evaluated and I agree with notes by Nurse Practitioner. Corena Pilgrim, MD

## 2013-12-25 NOTE — [facility] Suicide Risk Assessment (Signed)
Suicide Risk Assessment  Admission Assessment     Nursing information obtained from:  Patient Demographic factors:  Caucasian;Low socioeconomic status Current Mental Status:  NA Loss Factors:  Financial problems / change in socioeconomic status Historical Factors:  NA Risk Reduction Factors:  Living with another person, especially a relative  CLINICAL FACTORS:   Severe Anxiety and/or Agitation Alcohol/Substance Abuse/Dependencies Schizophrenia:   Paranoid or undifferentiated type Currently Psychotic Unstable or Poor Therapeutic Relationship Previous Psychiatric Diagnoses and Treatments  COGNITIVE FEATURES THAT CONTRIBUTE TO RISK:  Closed-mindedness Loss of executive function Polarized thinking    SUICIDE RISK:   Minimal: No identifiable suicidal ideation.  Patients presenting with no risk factors but with morbid ruminations; may be classified as minimal risk based on the severity of the depressive symptoms  PLAN OF CARE:1. Admit for crisis management and stabilization. 2. Medication management to reduce current symptoms to base line and improve the  patient's overall level of functioning 3. Treat health problems as indicated. 4. Develop treatment plan to decrease risk of relapse upon discharge and the need for  readmission. 5. Psycho-social education regarding relapse prevention and self care. 6. Health care follow up as needed for medical problems. 7. Restart home medications where appropriate.   I certify that inpatient services furnished can reasonably be expected to improve the patient's condition.  Thedore MinsAkintayo, Anuradha Chabot, MD 12/25/2013, 10:11 AM

## 2013-12-25 NOTE — Progress Notes (Signed)
Adult Psychoeducational Group Note  Date:  12/25/2013 Time:  0900 am  Group Topic/Focus:  Recovery Goals:   The focus of this group is to identify appropriate goals for recovery and establish a plan to achieve them.  Participation Level:  Active  Participation Quality:  Redirectable  Affect:  Anxious  Cognitive:  Disorganized  Insight: Lacking  Engagement in Group:  Off Topic  Modes of Intervention:  Clarification  Additional Comments:  Patient could not stay focused on topic. Needed redirection.  She was eager to participate.   Cranford MonBeaudry, Loreen Bankson Evans 12/25/2013, 5:42 PM

## 2013-12-25 NOTE — Progress Notes (Signed)
D: Patient in the bed on approach.  Patient did get up and stated she has a good day.  Patient tangential and has flight of idea.  Patient states she does not know why she is here and then talks about shoes and medications.  Patient denies SI/HI and denies AVH.  Patient states ,"It is not bedtime so I am going to go to bed at 10 o'clock." A: Staff to monitor Q 15 mins for safety.  Encouragement and support offered.  Scheduled medications not administered per orders.  Patient asleep. R: Patient remains safe on the unit.  Patient attended group tonight.  Patient visible on the unit for group.  Patient did not take mediations tonight.

## 2013-12-25 NOTE — [facility] Group Notes (Signed)
Adult Psychoeducational Group Note  Date:  12/25/2013 Time:  9:29 PM  Group Topic/Focus:  Wrap-Up Group:   The focus of this group is to help patients review their daily goal of treatment and discuss progress on daily workbooks.  Participation Level:  Minimal  Participation Quality:  Attentive  Affect:  Flat  Cognitive:  Alert  Insight: Limited  Engagement in Group:  Limited  Modes of Intervention:  Discussion  Additional Comments:  [name] [name] stated her day was ok and she attended groups.  She expressed that her two positives qualities are her hands and sight.  [name] [name], [name] [name] 12/25/2013, 9:29 PM

## 2013-12-25 NOTE — Tx Team (Signed)
  Interdisciplinary Treatment Plan Update   Date Reviewed:  12/25/2013  Time Reviewed:  8:00 AM  Progress in Treatment:   Attending groups: Yes Participating in groups: Yes Taking medication as prescribed: Yes  Tolerating medication: Yes Family/Significant other contact made: No Patient understands diagnosis: No  Limited insight Discussing patient identified problems/goals with staff: Yes  See initial care plan Medical problems stabilized or resolved: Yes Denies suicidal/homicidal ideation: Yes In tx team Patient has not harmed self or others: Yes  For review of initial/current patient goals, please see plan of care.  Estimated Length of Stay:  4-5 days  Reason for Continuation of Hospitalization: Delusions  Medication stabilization Other; describe Paranoia, RIS  New Problems/Goals identified:  N/A  Discharge Plan or Barriers:   return home, follow up [facility]  Additional Comments:  HPI: Patient is a 43 year old Caucasian female, unemployed and lives with her mother. She has a history of Schizophrenia. She has been non compliant with medications and not sleeping. Patient is a poor historian. She is malodorous, disheveled, noted to have poor dentition; speaking with word salad. She has rambling speech about various topics, such as Obama, FBI, [employer] and taking fingerprints, so she can get her "payout."   Attendees:  Signature: Thedore MinsMojeed Akintayo, MD 12/25/2013 8:00 AM   Signature: Richelle Itood Kyriakos Babler, LCSW 12/25/2013 8:00 AM  Signature: Fransisca KaufmannLaura Davis, NP 12/25/2013 8:00 AM  Signature: Joslyn Devonaroline Beaudry, RN 12/25/2013 8:00 AM  Signature: Liborio NixonPatrice White, RN 12/25/2013 8:00 AM  Signature:  12/25/2013 8:00 AM  Signature:   12/25/2013 8:00 AM  Signature:    Signature:    Signature:    Signature:    Signature:    Signature:      Scribe for Treatment Team:   Richelle Itood Kamrin Sibley, LCSW  12/25/2013 8:00 AM

## 2013-12-26 MED ORDER — HALOPERIDOL 5 MG PO TABS
10.0000 mg | ORAL_TABLET | Freq: Two times a day (BID) | ORAL | Status: DC
  Administered 2013-12-26 – 2013-12-28 (×4): 10 mg via ORAL
  Filled 2013-12-26 (×7): qty 2

## 2013-12-26 MED ORDER — CARBAMAZEPINE ER 200 MG PO TB12
200.0000 mg | ORAL_TABLET | Freq: Two times a day (BID) | ORAL | Status: DC
  Administered 2013-12-26 – 2014-01-02 (×15): 200 mg via ORAL
  Filled 2013-12-26 (×18): qty 1

## 2013-12-26 NOTE — Progress Notes (Signed)
D: Pt presents anxious this morning, rapid, pressured speech, flight of ideas, tangential thoughts and moderate confusion. Pt denies hearing voices, stating, "I'm here because of my legs and finger prints by the psychiatric department. Pt fixated on serial killers who she believes are in her home and how they  killed her and her mother. Pt is paranoid and delusional. A: Medications administered as ordered per MD. Verbal support given. Pt encouraged to attend groups. 15 minute checks performed for safety. R: Pt safety maintained. Pt compliant with taking meds and attending groups.

## 2013-12-26 NOTE — [facility] Group Notes (Signed)
[facility]  12/26/2013  11:31 AM  Type of Therapy:  [facility] [employer] Presentation   Participation Level:  Minimal  Participation Quality:  Inattentive  Affect:  Anxious  Cognitive:  Disorganized  Insight:  Poor  Engagement in Therapy:  Poor  Modes of Intervention:  Discussion, Education and Socialization   Summary of Progress/Problems:  [name] [name] from [facility] [employer] came to present his recovery story and play the guitar.  [name] [name] constantly switched from standing and sitting in her chair.  She kept on coming in and out of group.    [name] [name] asked if the speaker had anything to do with the government, but left immediately afterwards.   She interrupted the speaker and asked the MSW Intern why she was not allow to smoke in the facility.  MSW Intern asked her to either stay in or out of group, [name] [name] chose to sit down and listen while the speaker played his guitar.  However, she left after 5 minutes and did not return to group.    [name] [name]   12/26/2013  11:31 AM

## 2013-12-26 NOTE — Progress Notes (Signed)
D: Patent in her room on approach.  Patient was in the bathroom and she ran out of the bathroom naked stating she had forgotten her shampoo.  Patient had water all over the floor at this time.  Writer instructed patient that this was a fall hazard and clean water off the floor and put towel outside the shower.  Patient delusional and hyperverbal tonight.  Patient speaks of the FBI,fingerprints and an apartment with a wooden door.  Patient states someone killer her and her mother.  Patient denies SI/HI and denies AVH. A: Staff to monitor Q 15 mins for safety.  Encouragement and support offered.  Scheduled medications administered per orders. R: Patient remains safe on the unit.  Patient attended group tonight.  Patient visible on the unit.  Patient taking administered medications.

## 2013-12-26 NOTE — [facility] Group Notes (Signed)
[facility] Note   12/26/2013 11:03 AM  Participation Quality:  Per usual, [name] wandered in and out several times.  Did not sit.  Stood in the middle of the room for several minutes, wandered off.    Daryel GeraldNorth, Tran Arzuaga B
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
## 2013-12-27 NOTE — Progress Notes (Signed)
Patient ID: [name], female   DOB: 10/09/1971, 43 y.o.   MRN: [id number]  D: Pt. denies SI/HI and A/V Hallucinations. However, patient is clearly responding to internal stimuli. Patient is seen in her room talking to herself and reporting that "her name is [name] [name]." Patient does not elaborate but continues to be disorganized and tangential. Patient is fixated on smoking and the FBI. Patient reports, "I smoke cigarettes and drink beer."  Patient does not report any pain or discomfort at this time. Patient did not fill out her daily inventory sheet today.  A: Support and encouragement provided to the patient. Education provided to patient about this being a non-smoking facility. Scheduled medications given to patient per physician's orders.  R: Patient is receptive and cooperative but is hard to follow in conversation. Patient is seen in the milieu and is going to groups.  Q15 minute checks are maintained for safety.
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
## 2013-12-27 NOTE — Progress Notes (Signed)
Patient ID: [name], female   DOB: 02/22/1971, 43 y.o.   MRN: [id number]  Morning Wellness Group 10:00  The focus of this group is to educate the patient on the purpose and policies of crisis stabilization and provide a format to answer questions about their admission.  The group details unit policies and expectations of patients while admitted.  Patient attended group but was disorganized in thought and tangential. Patient stated, "I like to smoke cigarettes and drink beer." Patient was unable to identify a therapeutic leisure activity. Patient was pacing and fidgeting the whole group. Patient did not seem to be able to focus during group.
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
## 2013-12-27 NOTE — Progress Notes (Signed)
Patient ID: [name] [name], female   DOB: 1971-09-25, 43 y.o.   MRN: [id number] [facility] MD Progress Note  12/27/2013 12:33 PM [name] [name]  MRN:  [id number] Subjective: Patient reports that "There are serial killers everywhere around [place]."  Objective:  Patient continues to experience ongoing symptoms of paranoia, delusions, and anxiety. [name] [name]'s thought processes are very difficult to follow as they are very disorganized and tangential. She becomes very anxious when expressing her delusions about needing protection from serial killers. She talks rapidly about the FBI and her fingerprints being very important. [name] [name] remains disoriented as she thinks the year is 2010 and can only name her location as being somewhere in [place]. Patient became fixated during the conversation on asking writer to help get her back to [facility] to ensure her safety. The patient required constant redirection to answer The patient has extremely poor insight into her chronic schizophrenia and the need to comply with her psychiatric medications. She has a history of noncompliance outside the hospital setting but so far has been taking her scheduled medications. She was not receptive to the idea of being started on a long acting injectable medication.   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 Poor dentition         ADL's:  Intact  Sleep: Fair  Appetite:  Fair  Suicidal Ideation:  Plan:  denies Intent:  denies Means:   denies Homicidal Ideation:  Plan:  denies Intent:  denies Means:  denies AEB (as evidenced by):  Psychiatric Specialty Exam: Review of Systems  Constitutional: Negative.   HENT: Negative.   Eyes: Negative.   Respiratory: Negative.   Cardiovascular: Negative.   Gastrointestinal: Negative.   Genitourinary: Negative.   Musculoskeletal: Negative.   Skin: Negative.   Neurological: Negative.   Endo/Heme/Allergies: Negative.   Psychiatric/Behavioral: Positive for hallucinations and memory loss. Negative for depression, suicidal ideas and substance abuse. The patient is nervous/anxious and has insomnia.     Blood pressure 91/60, pulse 68, temperature 98.1 F (36.7 C), temperature source Oral, resp. rate 18, height 5' 6.5" (1.689 m), weight 57.607 kg (127 lb), last menstrual period 12/21/2013.Body mass index is 20.19 kg/(m^2).  General Appearance: Disheveled  Eye Solicitor::  Fair  Speech:  Garbled and Pressured  Volume:  Increased  Mood:  Anxious and Euphoric  Affect:  Full Range  Thought Process:  Circumstantial, Disorganized, Irrelevant and Loose  Orientation:  Full (Time, Place, and Person)  Thought Content:  Delusions and Hallucinations: Auditory  Suicidal Thoughts:  No  Homicidal Thoughts:  No  Memory:  Immediate;   Poor Recent;   Poor Remote;   Poor  Judgement:  Poor  Insight:  Lacking  Psychomotor Activity:  Increased  Concentration:  Poor  Recall:  Poor  Akathisia:  No  Handed:  Right  AIMS (if indicated):     Assets:  Desire for Improvement Social Support  Sleep:  Number of Hours: 6.75   Current Medications: Current Facility-Administered Medications  Medication Dose Route Frequency Provider Last Rate Last Dose  . acetaminophen (TYLENOL) tablet 650 mg  650 mg Oral Q6H PRN Kerry Hough, PA-C      . alum & mag hydroxide-simeth (MAALOX/MYLANTA) 200-200-20 MG/5ML suspension 30 mL  30 mL Oral Q4H PRN Kerry Hough, PA-C      . carbamazepine (TEGRETOL XR) 12 hr  tablet 200 mg  200 mg Oral BID [address]   200 mg at 12/27/13 0821  . chlordiazePOXIDE (LIBRIUM) capsule 25 mg  25 mg Oral Q6H PRN Kerry Hough, PA-C      . haloperidol (HALDOL) tablet 10 mg  10 mg Oral BID Sinclaire Artiga   10 mg at 12/27/13 0821  . loperamide (IMODIUM) capsule 2-4 mg  2-4 mg Oral PRN Kerry Hough, PA-C      . magnesium hydroxide (MILK OF MAGNESIA) suspension 30 mL  30 mL Oral Daily PRN Kerry Hough, PA-C      . multivitamin with minerals tablet 1 tablet  1 tablet Oral Daily Kerry Hough, PA-C   1 tablet at 12/27/13 1610  . OLANZapine zydis (ZYPREXA) disintegrating tablet 10 mg  10 mg Oral Q8H PRN Ivyana Locey      . ondansetron (ZOFRAN-ODT) disintegrating tablet 4 mg  4 mg Oral Q6H PRN Kerry Hough, PA-C      . thiamine (B-1) injection 100 mg  100 mg Intramuscular Once [employer], PA-C      . thiamine (VITAMIN B-1) tablet 100 mg  100 mg Oral Daily Kerry Hough, PA-C   100 mg at 12/27/13 9604  . traZODone (DESYREL) tablet 100 mg  100 mg Oral QHS Majesty Stehlin   100 mg at 12/26/13 2116  . trihexyphenidyl (ARTANE) tablet 5 mg  5 mg Oral BID WC Phoebie Shad   5 mg at 12/27/13 5409    Lab Results: No results found for this or any previous visit (from the past 48 hour(s)).  Physical Findings: AIMS: Facial and Oral Movements Muscles of Facial Expression: None, normal Lips and Perioral Area: None, normal Jaw: None, normal Tongue: None, normal,Extremity Movements Upper (arms, wrists, hands, fingers): None, normal Lower (legs, knees, ankles, toes): None, normal, Trunk Movements Neck, shoulders, hips: None, normal, Overall Severity Severity of abnormal movements (highest score from questions above): None, normal Incapacitation due to abnormal movements: None, normal Patient's awareness of abnormal movements (rate only patient's report): No Awareness, Dental Status Current problems with teeth and/or dentures?: Yes (missing teeth upper/lower,  stained teeth) Does patient usually wear dentures?: No  CIWA:  CIWA-Ar Total: 3 COWS:     Treatment Plan Summary: Daily contact with patient to assess and evaluate symptoms and progress in treatment Medication management  Plan:1. Continue crisis management and stabilization. 2. Medication management to reduce current symptoms to base line and improve the patient's overall level of functioning: Continue Haldol to 10mg  po BID for delusions/psychosis, Artane 5mg  po BID for EPS Prevention, Tegretol XR 200mg  po BID for mood lability. 3. Treat health problems as indicated. Vitals reviewed and stable.  4. Develop treatment plan to decrease risk of relapse upon discharge and the need for readmission. 5. Psycho-social education regarding relapse prevention and self care. 6. Health care follow up as needed for medical problems. 7. Restart home medications where appropriate.  Medical Decision Making Problem Points:  Established problem, worsening (2), Review of last therapy session (1) and Review of psycho-social stressors (1) Data Points:  Order Aims Assessment (2) Review of medication regiment & side effects (2) Review of new medications or change in dosage (2)  I certify that inpatient services furnished can reasonably be expected to improve the patient's condition.   Fransisca KaufmannDAVIS, LAURA, NP-C 12/27/2013, 12:33 PM  Patient seen, evaluated and I agree with notes by Nurse Practitioner. Thedore MinsMojeed Laurana Magistro, MD

## 2013-12-27 NOTE — Progress Notes (Signed)
Adult Psychoeducational Group Note  Date:  12/27/2013 Time:  1015  Group Topic/Focus:  Crisis Planning:   The purpose of this group is to help patients create a crisis plan for use upon discharge or in the future, as needed.  Participation Level:  None  Participation Quality:  Inattentive  Affect:  Lethargic  Cognitive:  Delusional  Insight: None  Engagement in Group:  None  Modes of Intervention:  Discussion and Education  Additional Comments:  PT WAS IN GROUP BUT DID NOT PARTICIPATE. PT. STOOD UP DURING GROUP STARING AT WALL SPEAKING TO HERSELF, NO INSIGHT  Lena Gores A 12/27/2013, 7:13 AM

## 2013-12-27 NOTE — [facility] Group Notes (Signed)
[facility] Group Notes:  (Counselor/Nursing/MHT/Case Management/Adjunct)  12/27/2013 1:15PM  Type of Therapy:  Group Therapy  Participation Level:  [name] [name] stayed for awhile.  She engaged in the conversation while pacing.  She spoke over others, and engaged in the subject, yet did not track nor make any sense.  She left, came back in, sat down and talked about  about cigarettes, then left.  The sitting is unusual, which may be an indication that she is making some progress.  Summary of Progress/Problems: The topic for group was balance in life.  Pt participated in the discussion about when their life was in balance and out of balance and how this feels.  Pt discussed ways to get back in balance and short term goals they can work on to get where they want to be.    [name], [name] [name] B 12/27/2013 1:26 PM

## 2013-12-27 NOTE — Progress Notes (Signed)
Didn't attend group 

## 2013-12-28 MED ORDER — HALOPERIDOL 5 MG PO TABS
15.0000 mg | ORAL_TABLET | Freq: Two times a day (BID) | ORAL | Status: DC
  Administered 2013-12-28 – 2014-01-02 (×10): 15 mg via ORAL
  Filled 2013-12-28 (×12): qty 3

## 2013-12-28 MED ORDER — TRIHEXYPHENIDYL HCL 5 MG PO TABS
7.5000 mg | ORAL_TABLET | Freq: Two times a day (BID) | ORAL | Status: DC
  Administered 2013-12-28 – 2014-01-02 (×9): 7.5 mg via ORAL
  Filled 2013-12-28 (×12): qty 2

## 2013-12-28 NOTE — Progress Notes (Signed)
Patient ID: [name] [name], female   DOB: 10-Mar-1971, 43 y.o.   MRN: [id number] [facility] MD Progress Note  12/28/2013 10:39 AM [name] [name]  MRN:  [id number] Subjective: Patient reports that "There are serial killers everywhere around [place], even in my house and my neighborhood.'' Objective:  Patient with no insight into her mental illness, she continues to experience ongoing symptoms of psychosis, paranoia, delusions, racing thoughts and rambling speech. Her thought processes is bizarre, disorganized, loose and tangential. She has a fixed delusions about FBI, serial killers in her home and  the government owing her billions of dollars. Patient is also fixated on getting back to [facility]. She is very intrusive and  required frequent redirections, patient also has difficulty staying on topics. Patient is compliant with her medications and has not verbalized any adverse reactions. She is still not receptive to the idea of being started on a long acting injectable medication.   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 Poor dentition         ADL's:  Intact  Sleep: Fair  Appetite:  Fair  Suicidal Ideation:  Plan:  denies Intent:  denies Means:  denies Homicidal Ideation:  Plan:  denies Intent:  denies Means:  denies AEB (as evidenced by):  Psychiatric Specialty Exam: Review of Systems  Constitutional: Negative.   HENT: Negative.   Eyes: Negative.   Respiratory: Negative.   Cardiovascular:  Negative.   Gastrointestinal: Negative.   Genitourinary: Negative.   Musculoskeletal: Negative.   Skin: Negative.   Neurological: Negative.   Endo/Heme/Allergies: Negative.   Psychiatric/Behavioral: Positive for hallucinations and memory loss. Negative for depression, suicidal ideas and substance abuse. The patient is nervous/anxious and has insomnia.     Blood pressure 95/64, pulse 78, temperature 98.7 F (37.1 C), temperature source Oral, resp. rate 16, height 5' 6.5" (1.689 m), weight 57.607 kg (127 lb), last menstrual period 12/21/2013.Body mass index is 20.19 kg/(m^2).  General Appearance: Disheveled  Eye Solicitor::  Fair  Speech:  Garbled and Pressured  Volume:  Increased  Mood:  Anxious and Euphoric  Affect:  Full Range  Thought Process:  Circumstantial, Disorganized, Irrelevant and Loose  Orientation:  Full (Time, Place, and Person)  Thought Content:  Delusions and Hallucinations: Auditory  Suicidal Thoughts:  No  Homicidal Thoughts:  No  Memory:  Immediate;   Poor Recent;   Poor Remote;   Poor  Judgement:  Poor  Insight:  Lacking  Psychomotor Activity:  Increased  Concentration:  Poor  Recall:  Poor  Akathisia:  No  Handed:  Right  AIMS (if indicated):     Assets:  Desire for Improvement Social Support  Sleep:  Number of Hours: 6   Current Medications: Current Facility-Administered Medications  Medication Dose Route Frequency Provider Last Rate Last Dose  . acetaminophen (TYLENOL) tablet 650 mg  650 mg Oral Q6H PRN Kerry Hough, PA-C      . alum & mag hydroxide-simeth (MAALOX/MYLANTA) 200-200-20 MG/5ML suspension 30 mL  30 mL Oral Q4H PRN Kerry Hough, PA-C      .  carbamazepine (TEGRETOL XR) 12 hr tablet 200 mg  200 mg Oral BID Jermario Kalmar   200 mg at 12/28/13 0749  . haloperidol (HALDOL) tablet 15 mg  15 mg Oral BID Georgeanne Frankland      . magnesium hydroxide (MILK OF MAGNESIA) suspension 30 mL  30 mL Oral Daily PRN Kerry HoughSpencer E Simon, PA-C      .  multivitamin with minerals tablet 1 tablet  1 tablet Oral Daily Kerry HoughSpencer E Simon, PA-C   1 tablet at 12/28/13 0749  . OLANZapine zydis (ZYPREXA) disintegrating tablet 10 mg  10 mg Oral Q8H PRN Narda Fundora   10 mg at 12/28/13 0749  . thiamine (B-1) injection 100 mg  100 mg Intramuscular Once IntelSpencer E Simon, PA-C      . thiamine (VITAMIN B-1) tablet 100 mg  100 mg Oral Daily Kerry HoughSpencer E Simon, PA-C   100 mg at 12/28/13 0749  . traZODone (DESYREL) tablet 100 mg  100 mg Oral QHS Betul Brisky   100 mg at 12/26/13 2116  . trihexyphenidyl (ARTANE) tablet 7.5 mg  7.5 mg Oral BID WC Carroll Ranney        Lab Results: No results found for this or any previous visit (from the past 48 hour(s)).  Physical Findings: AIMS: Facial and Oral Movements Muscles of Facial Expression: None, normal Lips and Perioral Area: None, normal Jaw: None, normal Tongue: None, normal,Extremity Movements Upper (arms, wrists, hands, fingers): None, normal Lower (legs, knees, ankles, toes): None, normal, Trunk Movements Neck, shoulders, hips: None, normal, Overall Severity Severity of abnormal movements (highest score from questions above): None, normal Incapacitation due to abnormal movements: None, normal Patient's awareness of abnormal movements (rate only patient's report): No Awareness, Dental Status Current problems with teeth and/or dentures?: Yes (missing teeth upper/lower, stained teeth) Does patient usually wear dentures?: No  CIWA:  CIWA-Ar Total: 3 COWS:     Treatment Plan Summary: Daily contact with patient to assess and evaluate symptoms and progress in treatment Medication management  Plan:1. Continue crisis management and stabilization. 2. Medication management to reduce current symptoms to base line and improve the patient's overall level of functioning: Increase Haldol to 15mg  po BID for delusions/psychosis, Artane to 7.5mg  po BID for EPS Prevention and continueTegretol XR 200mg  po BID for mood  lability. 3. Treat health problems as indicated. Vitals reviewed and stable.  4. Develop treatment plan to decrease risk of relapse upon discharge and the need for readmission. 5. Psycho-social education regarding relapse prevention and self care. 6. Health care follow up as needed for medical problems.   Medical Decision Making Problem Points:  Established problem, worsening (2), Review of last therapy session (1) and Review of psycho-social stressors (1) Data Points:  Order Aims Assessment (2) Review of medication regiment & side effects (2) Review of new medications or change in dosage (2)  I certify that inpatient services furnished can reasonably be expected to improve the patient's condition.   Renessa Wellnitz,MD 12/28/2013, 10:39 AM
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
## 2013-12-28 NOTE — [facility] Group Notes (Signed)
[facility]   12/28/2013 10:23 AM  Participation Quality:  Engaged [relatively speaking]  Mood/Affect:  Excited  Depression Rating:    Anxiety Rating:    Thoughts of Suicide:  No Will you contract for safety?   NA  Current AVH:  Denies, but is obviously RIS  Plan for Discharge/Comments:  [name] wandered in and out several times.  Each time she returned, she stood and swayed in place.  She asked if it was her turn to speak.  I told her it was.  She told a rambling story about [place], [place], glass doors, intruders and the police.  Also worked [place] into her descriptions several times, and a man who looks like Elvis.  When asked, said it [place] about 4 [place] travel by bus [place] to [place].  Transportation Means: mother  Supports: mother  Ida Rogueorth, Ahmaud Duthie B

## 2013-12-28 NOTE — Progress Notes (Signed)
Adult Psychoeducational Group Note  Date:  12/28/2013 Time:  10:27 AM  Group Topic/Focus:  Early Warning Signs:   The focus of this group is to help patients identify signs or symptoms they exhibit before slipping into an unhealthy state or crisis.  Participation Level:  Minimal  Participation Quality:  Attentive and Monopolizing  Affect:  Flat  Cognitive:  Disorganized, Delusional and Lacking  Insight: Lacking and Limited  Engagement in Group:  Distracting, Limited, Monopolizing and Off Topic  Modes of Intervention:  Discussion, Education, Exploration, Socialization and Support  Additional Comments:  Pt came to group and answered questions, but the answers were disorganized and off topic. This writer could not follow what the pt was talking about. Pt came in and out of group and then left and did not return. Pt had to be redirected from speaking over people and monopolizing the conversation.   Cathlean CowerClouse, Linet Brash Y 12/28/2013, 10:27 AM

## 2013-12-28 NOTE — [facility] Group Notes (Cosign Needed)
[facility] LCSW Group Therapy  12/28/2013 1:09 PM  Type of Therapy:  Group Therapy   Participation Level: Did not attend.    Summary of Progress/Problems:  Chaplain was here to lead a group on themes of hope and courage.      Simona Huhina Guiseppe Flanagan   12/28/2013  1:09 PM

## 2013-12-29 DIAGNOSIS — F200 Paranoid schizophrenia: Principal | ICD-10-CM

## 2013-12-29 DIAGNOSIS — F209 Schizophrenia, unspecified: Secondary | ICD-10-CM

## 2013-12-29 NOTE — [facility] Group Notes (Signed)
[facility] Group Notes:  (Nursing/MHT/Case Management/Adjunct)  Date:  12/29/2013  Time:  0930  Type of Therapy:  Nurse Education  Participation Level:  Did not attend  Participation Quality:    Affect:    Cognitive:    Insight:    Engagement in Group:    Modes of Intervention:    Summary of Progress/Problems:  [name] [name], [name] [name] 12/29/2013, 10:47 AM

## 2013-12-29 NOTE — [facility] Group Notes (Signed)
[facility] Group Notes: (Clinical Social Work)   12/29/2013      Type of Therapy:  Group Therapy   Participation Level:  Did Not Attend    Ambrose MantleMareida Grossman-Orr, LCSW 12/29/2013, 1:16 PM
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
## 2013-12-29 NOTE — Progress Notes (Signed)
[facility] Group Notes:  (Nursing/MHT/Case Management/Adjunct)  Date:  12/28/2013  Time:  8:00 p.m.   Type of Therapy:  Psychoeducational Skills  Participation Level:  Active  Participation Quality:  Redirectable  Affect:  Excited  Cognitive:  Disorganized  Insight:  Lacking  Engagement in Group:  Off Topic  Modes of Intervention:  Education  Summary of Progress/Problems: The patient described her day as having been "okay". She indicated that she met with her doctor today and that she was able to learn from the groups. In terms of the theme for the day, she expressed that her relapse prevention will involve going to the hospital. She also mentioned that she can talk to the hospital through the television.   Sherae Santino S 12/29/2013, 3:26 AM

## 2013-12-29 NOTE — Progress Notes (Signed)
Patient ID: [name] [name], female   DOB: [birth year], 43 y.o.   MRN: [id number] [facility] MD Progress Note  12/29/2013 11:27 AM [name] [name]  MRN:  [id number] Subjective: Patient reports that she needs to see a court, there are serial killers around. Objective:  Patient with no insight into her illness. Remains paranoid, guarded and says "i dont know to most of questions". Reported in the past FBI was following her. Denies hallucinations. Did not ask any questions about medications but taking it.    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 Poor dentition         ADL's:  Intact  Sleep: Fair  Appetite:  Fair  Suicidal Ideation:  Plan:  denies Intent:  denies Means:  denies Homicidal Ideation:  Plan:  denies Intent:  denies Means:  denies AEB (as evidenced by):  Psychiatric Specialty Exam: Review of Systems  Constitutional: Negative.   HENT: Negative.   Eyes: Negative.   Respiratory: Negative.   Cardiovascular: Negative.   Gastrointestinal: Negative.   Genitourinary: Negative.   Musculoskeletal: Negative.   Skin: Negative.   Neurological: Negative.   Endo/Heme/Allergies: Negative.   Psychiatric/Behavioral: Positive for hallucinations and memory loss. Negative for depression, suicidal ideas and substance abuse. The patient is nervous/anxious and has insomnia.     Blood pressure 102/66, pulse 61, temperature 98.5 F (36.9 C), temperature source Oral, resp. rate 18, height 5' 6.5" (1.689  m), weight 57.607 kg (127 lb), last menstrual period 12/21/2013.Body mass index is 20.19 kg/(m^2).  General Appearance: Disheveled  Eye Solicitor::  Fair  Speech:  Garbled and Pressured  Volume:  Increased  Mood:  Anxious and Euphoric  Affect:  Full Range  Thought Process:  Circumstantial, Disorganized, Irrelevant and Loose  Orientation:  Full (Time, Place, and Person)  Thought Content:  Delusions and Hallucinations: Auditory  Suicidal Thoughts:  No  Homicidal Thoughts:  No  Memory:  Immediate;   Poor Recent;   Poor Remote;   Poor  Judgement:  Poor  Insight:  Lacking  Psychomotor Activity:  Increased  Concentration:  Poor  Recall:  Poor  Akathisia:  No  Handed:  Right  AIMS (if indicated):     Assets:  Desire for Improvement Social Support  Sleep:  Number of Hours: 6.75   Current Medications: Current Facility-Administered Medications  Medication Dose Route Frequency Provider Last Rate Last Dose  . acetaminophen (TYLENOL) tablet 650 mg  650 mg Oral Q6H PRN Kerry Hough, PA-C      . alum & mag hydroxide-simeth (MAALOX/MYLANTA) 200-200-20 MG/5ML suspension 30 mL  30 mL Oral Q4H PRN Kerry Hough, PA-C      . carbamazepine (TEGRETOL XR) 12 hr tablet 200 mg  200 mg Oral BID Mojeed Akintayo   200 mg at 12/29/13 0830  . haloperidol (HALDOL) tablet 15 mg  15 mg Oral BID Mojeed Akintayo   15 mg at 12/29/13 0830  . magnesium hydroxide (MILK OF MAGNESIA) suspension 30 mL  30 mL Oral Daily PRN Kerry Hough, PA-C      . multivitamin with  minerals tablet 1 tablet  1 tablet Oral Daily Kerry HoughSpencer E Simon, PA-C   1 tablet at 12/29/13 0830  . OLANZapine zydis (ZYPREXA) disintegrating tablet 10 mg  10 mg Oral Q8H PRN Mojeed Akintayo   10 mg at 12/28/13 0749  . thiamine (B-1) injection 100 mg  100 mg Intramuscular Once IntelSpencer E Simon, PA-C      . thiamine (VITAMIN B-1) tablet 100 mg  100 mg Oral Daily Kerry HoughSpencer E Simon, PA-C   100 mg at 12/28/13 0749  . traZODone (DESYREL) tablet 100 mg  100 mg  Oral QHS Mojeed Akintayo   100 mg at 12/28/13 2117  . trihexyphenidyl (ARTANE) tablet 7.5 mg  7.5 mg Oral BID WC Mojeed Akintayo   7.5 mg at 12/28/13 1651    Lab Results: No results found for this or any previous visit (from the past 48 hour(s)).  Physical Findings: AIMS: Facial and Oral Movements Muscles of Facial Expression: None, normal Lips and Perioral Area: None, normal Jaw: None, normal Tongue: None, normal,Extremity Movements Upper (arms, wrists, hands, fingers): None, normal Lower (legs, knees, ankles, toes): None, normal, Trunk Movements Neck, shoulders, hips: None, normal, Overall Severity Severity of abnormal movements (highest score from questions above): None, normal Incapacitation due to abnormal movements: None, normal Patient's awareness of abnormal movements (rate only patient's report): No Awareness, Dental Status Current problems with teeth and/or dentures?: Yes (missing teeth upper/lower, stained teeth) Does patient usually wear dentures?: No  CIWA:  CIWA-Ar Total: 3 COWS:     Treatment Plan Summary: Daily contact with patient to assess and evaluate symptoms and progress in treatment Medication management  Plan:1. Continue crisis management and stabilization. 2. Medication management to reduce current symptoms to base line and improve the patient's overall level of functioning: Increase Haldol to 15mg  po BID for delusions/psychosis, Artane to 7.5mg  po BID for EPS Prevention and continueTegretol XR 200mg  po BID for mood lability. 3. Treat health problems as indicated. Vitals reviewed and stable.  4. Develop treatment plan to decrease risk of relapse upon discharge and the need for readmission. 5. Psycho-social education regarding relapse prevention and self care. 6. Health care follow up as needed for medical problems.   Medical Decision Making Problem Points:  Established problem, worsening (2), Review of last therapy session (1) and Review of psycho-social  stressors (1) Data Points:  Review medication side effects. No change in medications done.   I certify that inpatient services furnished can reasonably be expected to improve the patient's condition.   Jenea Dake,MD 12/29/2013, 11:27 AM

## 2013-12-30 NOTE — [facility] Group Notes (Signed)
[facility] Group Notes:  (Nursing/MHT/Case Management/Adjunct)  Date:  12/30/2013  Time:  1000  Type of Therapy:  spirituality  Participation Level:  Did Not Attend  Participation Quality:    Affect:    Cognitive:    Insight:    Engagement in Group:    Modes of Intervention:    Summary of Progress/Problems:  [name] [name], [name] [name] 12/30/2013, 11:36 AM

## 2013-12-30 NOTE — [facility] Group Notes (Signed)
[facility] Group Notes: (Clinical Social Work)   12/30/2013      Type of Therapy:  Group Therapy   Participation Level:  Did Not Attend    Ambrose MantleMareida Grossman-Orr, LCSW 12/30/2013, 1:01 PM

## 2013-12-30 NOTE — Progress Notes (Signed)
Patient ID: [name], female   DOB: 03/08/1971, 43 y.o.   MRN: [id number]   D: Patient childlike and fidgety but is cooperative with care. Pt tangential, needing redirection at times. A: Q 15 minute safety checks, encourage group participation, administer medications as ordered. R: Pt with no distress noted. Pt compliant with meds; no inappropriate behaviors noted.

## 2013-12-30 NOTE — Progress Notes (Signed)
Patient ID: [name], female   DOB: 08/08/1971, 43 y.o.   MRN: [id number] D. Patient presents disheveled with bizarre behaviors and disorganized thought process. In am patient states '' No I'm doing fine everything is ok. I want to go home now '' Pt completed self inventory and rates her depression at #1 on scale with 1 being least depressed 10 being worst. Pt has been isolative to room throughout most of shift but complaint with meals and medications. A. Support and encouragement provided. Medications given as ordered. R. Patient in no acute distress at this time, no further voiced concerns. Will continue to monitor q 15 minutes for safety.

## 2013-12-30 NOTE — Progress Notes (Signed)
Patient ID: [name] [name], female   DOB: 01/15/1971, 43 y.o.   MRN: [id number] [facility]  12/30/2013 11:05 AM [name] [name]  MRN:  [id number] Subjective: Patient reports that she needs to see a court but not talked about serial killers. Objective:  Remains paranoid, guarded and says "i dont know to most of questions". Remains quite and guarded aloof of being here or having any insight.   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 Poor dentition         ADL's:  Intact  Sleep: Fair  Appetite:  Fair  Suicidal Ideation:  Plan:  denies Intent:  denies Means:  denies Homicidal Ideation:  Plan:  denies Intent:  denies Means:  denies AEB (as evidenced by):  Psychiatric Specialty Exam: Review of Systems  Constitutional: Negative.   HENT: Negative.   Eyes: Negative.   Respiratory: Negative.   Cardiovascular: Negative.   Gastrointestinal: Negative.   Genitourinary: Negative.   Musculoskeletal: Negative.   Skin: Negative.   Neurological: Negative.   Endo/Heme/Allergies: Negative.   Psychiatric/Behavioral: Positive for hallucinations and memory loss. Negative for depression, suicidal ideas and substance abuse. The patient is nervous/anxious and has insomnia.     Blood pressure 102/66, pulse 61, temperature 98.5 F (36.9 C), temperature source Oral, resp. rate 18, height 5' 6.5" (1.689 m), weight 57.607 kg (127 lb), last menstrual period 12/21/2013.Body mass index is 20.19 kg/(m^2).   General Appearance: Disheveled  Eye SolicitorContact::  Fair  Speech:  Garbled and Pressured  Volume:  Increased  Mood:  Anxious and Euphoric  Affect:  Full Range  Thought Process:  Circumstantial, Disorganized, Irrelevant and Loose  Orientation:  Full (Time, Place, and Person)  Thought Content:  Delusions and Hallucinations: Auditory  Suicidal Thoughts:  No  Homicidal Thoughts:  No  Memory:  Immediate;   Poor Recent;   Poor Remote;   Poor  Judgement:  Poor  Insight:  Lacking  Psychomotor Activity:  Increased  Concentration:  Poor  Recall:  Poor  Akathisia:  No  Handed:  Right  AIMS (if indicated):     Assets:  Desire for Improvement Social Support  Sleep:  Number of Hours: 3.25   Current Medications: Current Facility-Administered Medications  Medication Dose Route Frequency Provider Last Rate Last Dose  . acetaminophen (TYLENOL) tablet 650 mg  650 mg Oral Q6H PRN Kerry HoughSpencer E Simon, PA-C      . alum & mag hydroxide-simeth (MAALOX/MYLANTA) 200-200-20 MG/5ML suspension 30 mL  30 mL Oral Q4H PRN Kerry HoughSpencer E Simon, PA-C      . carbamazepine (TEGRETOL XR) 12 hr tablet 200 mg  200 mg Oral BID Mojeed Akintayo   200 mg at 12/30/13 0755  . haloperidol (HALDOL) tablet 15 mg  15 mg Oral BID Mojeed Akintayo   15 mg at 12/30/13 0754  . magnesium hydroxide (MILK OF MAGNESIA) suspension 30 mL  30 mL Oral Daily PRN Kerry HoughSpencer E Simon, PA-C      . multivitamin with minerals tablet 1 tablet  1 tablet Oral Daily Kerry HoughSpencer E Simon, PA-C  1 tablet at 12/30/13 0755  . OLANZapine zydis (ZYPREXA) disintegrating tablet 10 mg  10 mg Oral Q8H PRN Mojeed Akintayo   10 mg at 12/30/13 0754  . thiamine (B-1) injection 100 mg  100 mg Intramuscular Once [employer], PA-C      . thiamine (VITAMIN B-1) tablet 100 mg  100 mg Oral Daily Kerry Hough, PA-C   100 mg at 12/30/13 0755  . traZODone (DESYREL) tablet 100 mg  100 mg Oral QHS Mojeed Akintayo   100 mg at 12/29/13 2111  . trihexyphenidyl (ARTANE) tablet 7.5 mg  7.5  mg Oral BID WC Mojeed Akintayo   7.5 mg at 12/30/13 1610    Lab Results: No results found for this or any previous visit (from the past 48 hour(s)).  Physical Findings: AIMS: Facial and Oral Movements Muscles of Facial Expression: None, normal Lips and Perioral Area: None, normal Jaw: None, normal Tongue: None, normal,Extremity Movements Upper (arms, wrists, hands, fingers): None, normal Lower (legs, knees, ankles, toes): None, normal, Trunk Movements Neck, shoulders, hips: None, normal, Overall Severity Severity of abnormal movements (highest score from questions above): None, normal Incapacitation due to abnormal movements: None, normal Patient's awareness of abnormal movements (rate only patient's report): No Awareness, Dental Status Current problems with teeth and/or dentures?: No Does patient usually wear dentures?: No  CIWA:  CIWA-Ar Total: 3 COWS:     Treatment Plan Summary: Daily contact with patient to assess and evaluate symptoms and progress in treatment Medication management  Plan:1. Continue crisis management and stabilization. 2. Medication management to reduce current symptoms to base line and improve the patient's overall level of functioning: Increase Haldol to 15mg  po BID for delusions/psychosis, Artane to 7.5mg  po BID for EPS Prevention and continueTegretol XR 200mg  po BID for mood lability. 3. Treat health problems as indicated. Vitals reviewed and stable.  4. Develop treatment plan to decrease risk of relapse upon discharge and the need for readmission. 5. Psycho-social education regarding relapse prevention and self care. 6. Health care follow up as needed for medical problems.   Medical Decision Making Problem Points:  Established problem, worsening (2), Review of last therapy session (1) and Review of psycho-social stressors (1) Data Points:  Review medication side effects. No change in medications done.   I certify that inpatient services furnished can  reasonably be expected to improve the patient's condition.   Saren Corkern,MD 12/30/2013, 11:05 AM

## 2013-12-31 MED ORDER — BENZOCAINE 10 % MT GEL
Freq: Three times a day (TID) | OROMUCOSAL | Status: DC | PRN
  Administered 2013-12-31 – 2014-01-02 (×5): via OROMUCOSAL
  Filled 2013-12-31: qty 9.4

## 2013-12-31 NOTE — Progress Notes (Signed)
D: Pt presents anxious this morning, pacing in hallway and hyper- verbal. Pt appears less disorganized and have not been observed talking about FBI or serial killers today. Pt able to stay on topic this morning and did not present with flight of ideas or racing thoughts. Pt c/o right gum pain and received tylenol for pain. Pt requesting a referral to see a Dentist. Pt compliant with taking meds and attending groups today. Pt able to attend groups and focus without disrupting. A: Medications administered as ordered per MD. Verbal support given. Pt encouraged to attend groups. 15 minute checks performed for safety. R: Pt safety maintained

## 2013-12-31 NOTE — [facility] Group Notes (Signed)
[facility]   12/31/2013 4:57 PM  Participation Quality:  Engaged  Mood/Affect:  Flat  Depression Rating:  denies  Anxiety Rating:  denies  Thoughts of Suicide:  No Will you contract for safety?   NA  Current AVH:  No  Plan for Discharge/Comments:  [name] [name] actually sat down, was attentive, and stayed for most of the group.  She stated she had a good weekend, and she needs to leave so she can have 4 teeth pulled.  Has not increased her insight since admission, but is much closer to being grounded in reality than she was when she first came in.  Transportation Means: mother  Supports: mother  Ida Rogueorth, Coraima Tibbs B

## 2013-12-31 NOTE — Progress Notes (Signed)
The focus of this group is to help patients review their daily goal of treatment and discuss progress on daily workbooks. Pt attended the evening group session but responded minimally to discussion prompts from the Writer. Pt reported having a good day on the unit for no specific reason. Pt also shared that she had no needs from Nursing Staff this evening. Her only goals were to remain on social security as long as she was able to. "They help people - it's their job - and they've helped me." Pt left group after five minutes.

## 2013-12-31 NOTE — Progress Notes (Signed)
D: Patient in bed on first approach.  Patient states she had a good day.  Patient states she is tired and thinks the medication is working.  Patient states she has had a problem with her teeth but states she is going to have them looked at when she leaves.  Patient minimal with Buyer, retailwriter tonight.  Patient denies SI/HI and denies AVH. A: Staff to monitor Q 15 mins for safety.  Encouragement and support offered.  Scheduled medications administered per orders. R: Patient remains safe on the unit.  Patient did not attend group tonight.  Patient taking administered medications.

## 2013-12-31 NOTE — Progress Notes (Signed)
Patient ID: [name] [name] Dunnam, female   DOB: 09/30/1971, 43 y.o.   MR[facility]: [id number] [facility]  12/31/2013 2:03 PM [name] [name] Ferrick  MRN:  [id number] Subjective:  Patient states "I am anxious about my toothache. I need a Dentist. I still think some serial killers are waiting for me. But I feel safe here. I'm nervous about going home because of all the crazy stuff going on out there."   Objective:  Patient is observed to be pacing the halls and appears very restless. She is fixated on talking about needing dental care. Nursing staff have reported that she is less disorganized and paranoid. The patient appears to be expressing these fears less but if asked directly will become tangential and very paranoid. She has been compliant with her oral Haldol. However, she has been very resistant to the recommendation from treatment team that she be started on a long acting injectable due to history of medication noncompliance with worsening of symptoms. There is concern that the patient will not take her medications after discharge and decompensate. Her mother remains supportive but has also been able to reason with patient about medication benefits. The patient continues to have no insight into the symptoms that are related to her chronic schizophrenia. Patient has had limited participation in groups due to her bizarre and disorganized thought processes.   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 Poor dentition          ADL's:  Intact  Sleep: Fair  Appetite:  Fair  Suicidal Ideation:  Plan:  denies Intent:  denies Means:  denies Homicidal Ideation:  Plan:  denies Intent:  denies Means:  denies AEB (as evidenced by):  Psychiatric Specialty Exam: Review of Systems  Constitutional: Negative.   HENT:       Patient complains of toothache from poor dentition.   Eyes: Negative.   Respiratory: Negative.   Cardiovascular: Negative.   Gastrointestinal: Negative.   Genitourinary: Negative.   Musculoskeletal: Negative.   Skin: Negative.   Neurological: Negative.   Endo/Heme/Allergies: Negative.   Psychiatric/Behavioral: Positive for hallucinations and memory loss. Negative for depression, suicidal ideas and substance abuse. The patient is nervous/anxious and has insomnia.     Blood pressure 107/69, pulse 73, temperature 98.2 F (36.8 C), temperature source Oral, resp. rate 18, height 5' 6.5" (1.689 m), weight 57.607 kg (127 lb), last menstrual period 12/21/2013.Body mass index is 20.19 kg/(m^2).  General Appearance: Disheveled  Eye SolicitorContact::  Fair  Speech:  Garbled and Pressured  Volume:  Increased  Mood:  Anxious and Euphoric  Affect:  Full Range  Thought Process:  Circumstantial, Disorganized, Irrelevant and Loose  Orientation:  Full (Time, Place, and Person)  Thought Content:  Delusions and Hallucinations: Auditory  Suicidal Thoughts:  No  Homicidal Thoughts:  No  Memory:  Immediate;   Poor Recent;   Poor Remote;   Poor  Judgement:  Poor  Insight:  Lacking  Psychomotor Activity:  Increased  Concentration:  Poor  Recall:  Poor  Akathisia:  No  Handed:  Right  AIMS (if  indicated):     Assets:  Desire for Improvement Social Support  Sleep:  Number of Hours: 3.25   Current Medications: Current Facility-Administered Medications  Medication Dose Route Frequency Provider Last Rate Last Dose  . acetaminophen (TYLENOL) tablet 650 mg  650 mg Oral Q6H PRN Kerry Hough, PA-C   650  mg at 12/31/13 0748  . alum & mag hydroxide-simeth (MAALOX/MYLANTA) 200-200-20 MG/5ML suspension 30 mL  30 mL Oral Q4H PRN Kerry Hough, PA-C      . carbamazepine (TEGRETOL XR) 12 hr tablet 200 mg  200 mg Oral BID Rorie Delmore   200 mg at 12/31/13 0749  . haloperidol (HALDOL) tablet 15 mg  15 mg Oral BID Jniya Madara   15 mg at 12/31/13 0748  . magnesium hydroxide (MILK OF MAGNESIA) suspension 30 mL  30 mL Oral Daily PRN Kerry Hough, PA-C      . multivitamin with minerals tablet 1 tablet  1 tablet Oral Daily Kerry Hough, PA-C   1 tablet at 12/31/13 [phone]  . OLANZapine zydis (ZYPREXA) disintegrating tablet 10 mg  10 mg Oral Q8H PRN Dezirae Service   10 mg at 12/30/13 0754  . thiamine (B-1) injection 100 mg  100 mg Intramuscular Once [employer], PA-C      . thiamine (VITAMIN B-1) tablet 100 mg  100 mg Oral Daily Kerry Hough, PA-C   100 mg at 12/31/13 0749  . traZODone (DESYREL) tablet 100 mg  100 mg Oral QHS Jesi Jurgens   100 mg at 12/30/13 2109  . trihexyphenidyl (ARTANE) tablet 7.5 mg  7.5 mg Oral BID WC Creig Landin   7.5 mg at 12/31/13 0757    Lab Results: No results found for this or any previous visit (from the past 48 hour(s)).  Physical Findings: AIMS: Facial and Oral Movements Muscles of Facial Expression: None, normal Lips and Perioral Area: None, normal Jaw: None, normal Tongue: None, normal,Extremity Movements Upper (arms, wrists, hands, fingers): None, normal Lower (legs, knees, ankles, toes): None, normal, Trunk Movements Neck, shoulders, hips: None, normal, Overall Severity Severity of abnormal movements (highest score from questions above): None, normal Incapacitation due to abnormal movements: None, normal Patient's awareness of abnormal movements (rate only patient's report): No Awareness, Dental Status Current problems with teeth and/or dentures?: No Does patient usually wear dentures?: No  CIWA:  CIWA-Ar Total: 3 COWS:     Treatment  Plan Summary: Daily contact with patient to assess and evaluate symptoms and progress in treatment Medication management  Plan:1. Continue crisis management and stabilization. 2. Medication management to reduce current symptoms to base line and improve the patient's overall level of functioning: Continue Haldol 15mg  po BID for delusions/psychosis, Artane to 7.5mg  po BID for EPS Prevention and continueTegretol XR 200mg  po BID for mood lability. 3. Treat health problems as indicated. Vitals reviewed and stable.  4. Develop treatment plan to decrease risk of relapse upon discharge and the need for readmission. Continue to encourage patient to accept the long acting injectable Haldol Decanoate.  5. Psycho-social education regarding relapse prevention and self care. 6. Health care follow up as needed for medical problems. Patient receiving tylenol prn for tooth pain.   Medical Decision Making Problem Points:  Established problem, stable/improving (1), Review of last therapy session (1) and Review of psycho-social stressors (1) Data Points:  Order Aims Assessment (2)  Review of medication regiment & side effects (2)  Review of new medications or change in dosage (2)  I certify that inpatient services furnished can reasonably be expected to improve the patient's condition.   Fransisca Kaufmann, NP-C 12/31/2013, 2:03 PM  Patient seen, evaluated and I agree with notes by Nurse Practitioner. Thedore Mins, MD

## 2013-12-31 NOTE — [facility] Group Notes (Signed)
[facility] LCSW Group Therapy  12/31/2013 1:15 pm  Type of Therapy: Process Group Therapy  Participation Level:  Active  Participation Quality:  Appropriate  Affect:  Flat  Cognitive:  Oriented  Insight:  Improving  Engagement in Group:  Limited  Engagement in Therapy:  Limited  Modes of Intervention:  Activity, Clarification, Education, Problem-solving and Support  Summary of Progress/Problems: Today's group addressed the issue of overcoming obstacles.  Patients were asked to identify their biggest obstacle post d/c that stands in the way of their on-going success, and then problem solve as to how to manage this.  [name] stayed for about half of the group.  The second time she returned to the group room, she plopped down, looked directly at me and asked if it was her turn.  When I told her it was, she thanked me and asked what the question was.  Her response was, "I think I'm blind in my right eye and I need my teeth pulled.  That's mainly why I am here."  Went on to talk about SSDI and a conspiracy to rake away her money "because they know I am a millionaire."  [name], Guerry Covington B 12/31/2013   5:00 PM

## 2014-01-01 NOTE — Progress Notes (Signed)
D:  Per pt self inventory pt reports sleeping well, appetite good, energy level normal, ability to pay attention good, rates depression at a 1 out of 10 and hopelessness at a 1 out of 10, denies SI/HI/AVH, hyper, restless, but denies anxiety, c/o gum and tooth pain, orajel and tylenol prn.      A:  Emotional support provided, Encouraged pt to continue with treatment plan and attend all group activities, q15 min checks maintained for safety.  R:  Pt is receptive, bright, tangential/pleasantly disorganized during interaction, states that she is afraid that someone will be able to easily break into her home and wants the manager at her apartment to fix her door so that it is safer.

## 2014-01-01 NOTE — Progress Notes (Signed)
Patient ID: [name] [name], female   DOB: [birth year], 43 y.o.   MRN: [id number] [facility] MD Progress Note  01/01/2014 10:12 AM [name] [name]  MRN:  [id number] Subjective:  Patient states "I need my tooth fixed and the entrance door of my house. My entrance door is is made of glass and the serial killer can come straight through that to kill [place] in our house.'' Objective: Patient thought process remains bizarre and disorganized. She continues to verbalize a fixed delusion about FBI, serial killers and the government owing her a million dollars. Patient has been compliant with her oral medications and has refused monthly injectable antipsychotic. Diagnosis:   DSM5: Schizophrenia Disorders:  Delusional Disorder (297.1) and Schizophrenia (295.7) Obsessive-Compulsive Disorders:  denies Trauma-Stressor Disorders:  Posttraumatic Stress Disorder (309.81) Substance/Addictive Disorders:  Alcohol Intoxication with Use Disorder - Moderate (F10.229) Depressive Disorders:  Disruptive Mood Dysregulation Disorder (296.99)  Axis I: Chronic Paranoid Schizophrenia            PTSD by history Axis II: Deferred Axis III:  Past Medical History  Diagnosis Date  . Poor dentition         ADL's:  Intact  Sleep: Fair  Appetite:  Fair  Suicidal Ideation:  Plan:  denies Intent:  denies Means:  denies Homicidal Ideation:  Plan:  denies Intent:  denies Means:  denies AEB (as evidenced by):  Psychiatric Specialty Exam: Review of Systems  Constitutional: Negative.   HENT:       Patient complains of toothache from poor dentition.   Eyes: Negative.   Respiratory: Negative.   Cardiovascular: Negative.   Gastrointestinal: Negative.   Genitourinary: Negative.   Musculoskeletal: Negative.   Skin: Negative.   Neurological: Negative.   Endo/Heme/Allergies: Negative.   Psychiatric/Behavioral: Negative for depression, suicidal ideas and substance abuse. The patient is nervous/anxious.     Blood pressure 100/68, pulse  76, temperature 98 F (36.7 C), temperature source Oral, resp. rate 16, height 5' 6.5" (1.689 m), weight 57.607 kg (127 lb), last menstrual period 12/21/2013.Body mass index is 20.19 kg/(m^2).  General Appearance: fairly groomed  Patent attorney::  Fair  Speech:  Garbled and Pressured  Volume:  Increased  Mood:  Anxious  Affect:  Full Range  Thought Process:  Circumstantial, Disorganized, Irrelevant and Loose  Orientation:  Full (Time, Place, and Person)  Thought Content:  Delusions   Suicidal Thoughts:  No  Homicidal Thoughts:  No  Memory:  Immediate;   Poor Recent;   Poor Remote;   Poor  Judgement: impaired  Insight:  Lacking  Psychomotor Activity:  Increased  Concentration:  Poor  Recall:  Poor  Akathisia:  No  Handed:  Right  AIMS (if indicated):     Assets:  Desire for Improvement Social Support  Sleep:  Number of Hours: 6.75   Current Medications: Current Facility-Administered Medications  Medication Dose Route Frequency Provider Last Rate Last Dose  . acetaminophen (TYLENOL) tablet 650 mg  650 mg Oral Q6H PRN Kerry Hough, PA-C   650 mg at 01/01/14 0750  . alum & mag hydroxide-simeth (MAALOX/MYLANTA) 200-200-20 MG/5ML suspension 30 mL  30 mL Oral Q4H PRN Kerry Hough, PA-C      . benzocaine (ORAJEL) 10 % mucosal gel   Mouth/Throat TID PRN Fransisca Kaufmann, NP      . carbamazepine (TEGRETOL XR) 12 hr tablet 200 mg  200 mg Oral BID Rube Sanchez   200 mg at 01/01/14 0747  . haloperidol (HALDOL) tablet 15 mg  15 mg  Oral BID Cheyla Duchemin   15 mg at 01/01/14 0747  . magnesium hydroxide (MILK OF MAGNESIA) suspension 30 mL  30 mL Oral Daily PRN Kerry HoughSpencer E Simon, PA-C      . multivitamin with minerals tablet 1 tablet  1 tablet Oral Daily Kerry HoughSpencer E Simon, PA-C   1 tablet at 01/01/14 0747  . OLANZapine zydis (ZYPREXA) disintegrating tablet 10 mg  10 mg Oral Q8H PRN Rajeev Escue   10 mg at 12/30/13 0754  . thiamine (B-1) injection 100 mg  100 mg Intramuscular Once Cardinal HealthSpencer E  Simon, PA-C      . thiamine (VITAMIN B-1) tablet 100 mg  100 mg Oral Daily Kerry HoughSpencer E Simon, PA-C   100 mg at 01/01/14 0747  . traZODone (DESYREL) tablet 100 mg  100 mg Oral QHS Zahi Plaskett   100 mg at 12/31/13 2210  . trihexyphenidyl (ARTANE) tablet 7.5 mg  7.5 mg Oral BID WC Bernisha Verma   7.5 mg at 01/01/14 0747    Lab Results: No results found for this or any previous visit (from the past 48 hour(s)).  Physical Findings: AIMS: Facial and Oral Movements Muscles of Facial Expression: None, normal Lips and Perioral Area: None, normal Jaw: None, normal Tongue: None, normal,Extremity Movements Upper (arms, wrists, hands, fingers): None, normal Lower (legs, knees, ankles, toes): None, normal, Trunk Movements Neck, shoulders, hips: None, normal, Overall Severity Severity of abnormal movements (highest score from questions above): None, normal Incapacitation due to abnormal movements: None, normal Patient's awareness of abnormal movements (rate only patient's report): No Awareness, Dental Status Current problems with teeth and/or dentures?: No Does patient usually wear dentures?: No  CIWA:  CIWA-Ar Total: 3 COWS:     Treatment Plan Summary: Daily contact with patient to assess and evaluate symptoms and progress in treatment Medication management  Plan:1. Continue crisis management and stabilization. 2. Medication management to reduce current symptoms to base line and improve the patient's overall level of functioning: Continue Haldol 15mg  po BID for delusions/psychosis, Artane to 7.5mg  po BID for EPS Prevention and continueTegretol XR 200mg  po BID for mood lability. 3. Treat health problems as indicated. Vitals reviewed and stable.  4. Develop treatment plan to decrease risk of relapse upon discharge and the need for readmission. Continue to encourage patient to accept the long acting injectable Haldol Decanoate.  5. Psycho-social education regarding relapse prevention and self  care. 6. Health care follow up as needed for medical problems. Patient receiving tylenol prn for tooth pain.  8. Tegretol level on 01/02/14  Medical Decision Making Problem Points:  Established problem, stable/improving (1), Review of last therapy session (1) and Review of psycho-social stressors (1) Data Points:  Order Aims Assessment (2)  Review of medication regiment & side effects (2)  Review of new medications or change in dosage (2)  I certify that inpatient services furnished can reasonably be expected to improve the patient's condition.   Thedore MinsAkintayo, Derrin Currey, MD 01/01/2014, 10:12 AM

## 2014-01-01 NOTE — [facility] Group Notes (Signed)
Adult Psychoeducational Group Note  Date:  01/01/2014 Time:  0900am  Group Topic/Focus:  Orientation:   The focus of this group is to educate the patient on the purpose and policies of crisis stabilization and provide a format to answer questions about their admission.  The group details unit policies and expectations of patients while admitted.  Participation Level:  Active  Participation Quality:  Attentive and Sharing  Affect:  Anxious and Appropriate  Cognitive:  Disorganized  Insight: Lacking  Engagement in Group:  Engaged  Modes of Intervention:  Discussion and Orientation  Additional Comments:  Pt was attentive during group and shared appropriately, pleasant and engaged, had a difficult time staying on topic.  [name] [name], [name] [name] 01/01/2014, 11:03 AM

## 2014-01-01 NOTE — Progress Notes (Signed)
D: Patient in the hallway on first approach.  Patient states she had a good day.  Patient states she is ready for discharged but continues to have flight of ideas.  Patient denies SI/HI and denies AVH. A: Staff to monitor Q 15 mins for safety.  Encouragement and support offered.  Scheduled medications not administered patient asleep. R: Patient remains safe on the unit.  Patient did not attend group tonight.  Patient visible on the unit briefly.

## 2014-01-01 NOTE — [facility] Group Notes (Signed)
[facility] LCSW Group Therapy  01/01/2014 , 2:35 PM   Type of Therapy:  Group Therapy  Participation Level:  Active  Participation Quality:  Attentive  Affect:  Appropriate  Cognitive:  Alert  Insight:  Improving  Engagement in Therapy:  Engaged  Modes of Intervention:  Discussion, Exploration and Socialization  Summary of Progress/Problems: Today's group focused on the term Diagnosis.  Participants were asked to define the term, and then pronounce whether it is a negative, positive or neutral term.  [name] [name] stayed for the entire group. She was engaged and attentive.  She made several bizarre comments-"If you have a choice between taking meds and eating, choose meds because food causes cancer,"  among others.  Her mood was good.  [name] [name], [name] [name] 01/01/2014 , 2:35 PM

## 2014-01-01 NOTE — Tx Team (Signed)
  Interdisciplinary Treatment Plan Update   Date Reviewed:  01/01/2014  Time Reviewed:  10:48 AM  Progress in Treatment:   Attending groups: Yes Participating in groups: Yes Taking medication as prescribed: Yes  Tolerating medication: Yes Family/Significant other contact made: Yes  Patient understands diagnosis: Yes  Discussing patient identified problems/goals with staff: Yes Medical problems stabilized or resolved: Yes Denies suicidal/homicidal ideation: Yes Patient has not harmed self or others: Yes  For review of initial/current patient goals, please see plan of care.  Estimated Length of Stay:  Likely d/c tomorrow  Reason for Continuation of Hospitalization:   New Problems/Goals identified:  N/A  Discharge Plan or Barriers:   return home, follow up outpt  Additional Comments:  Patient states "I need my tooth fixed and the entrance door of my house. My entrance door is is made of glass and the serial killer can come straight through that to kill us in our house.''  Patient thought process remains bizarre and disorganized. She continues to verbalize a fixed delusion about FBI, serial killers and the government owing her a million dollars. Patient has been compliant with her oral medications and has refused monthly injectable antipsychotic.   Attendees:  Signature: Thedore MinsMojeed Akintayo, MD 01/01/2014 10:48 AM   Signature: Richelle Itood Acy Orsak, LCSW 01/01/2014 10:48 AM  Signature: Fransisca KaufmannLaura Davis, NP 01/01/2014 10:48 AM  Signature: Joslyn Devonaroline Beaudry, RN 01/01/2014 10:48 AM  Signature: Liborio NixonPatrice White, RN 01/01/2014 10:48 AM  Signature:  01/01/2014 10:48 AM  Signature:   01/01/2014 10:48 AM  Signature:    Signature:    Signature:    Signature:    Signature:    Signature:      Scribe for Treatment Team:   Richelle Itood Mallarie Voorhies, LCSW  01/01/2014 10:48 AM

## 2014-01-02 LAB — CARBAMAZEPINE LEVEL, TOTAL: CARBAMAZEPINE LVL: 5.8 ug/mL (ref 4.0–12.0)

## 2014-01-02 MED ORDER — TRIHEXYPHENIDYL HCL 5 MG PO TABS: 7.5000 mg | ORAL_TABLET | Freq: Two times a day (BID) | ORAL | Status: DC

## 2014-01-02 MED ORDER — ADULT MULTIVITAMIN W/MINERALS CH: 1.0000 | ORAL_TABLET | Freq: Every day | ORAL | Status: DC

## 2014-01-02 MED ORDER — TRAZODONE HCL 100 MG PO TABS: 100.0000 mg | ORAL_TABLET | Freq: Every day | ORAL | Status: DC

## 2014-01-02 MED ORDER — CARBAMAZEPINE ER 200 MG PO TB12: 200.0000 mg | ORAL_TABLET | Freq: Two times a day (BID) | ORAL | Status: DC

## 2014-01-02 MED ORDER — HALOPERIDOL 5 MG PO TABS: 15.0000 mg | ORAL_TABLET | Freq: Two times a day (BID) | ORAL | Status: DC

## 2014-01-02 NOTE — [facility] Group Notes (Signed)
[facility]   01/02/2014  8:45 AM  Participation Quality:  Did Not Attend - pt came in and out of group but did not participate  Reyes IvanChelsea Horton, LCSW 01/02/2014 10:08 AM

## 2014-01-02 NOTE — Discharge Summary (Signed)
Physician Discharge Summary Note  Patient:  [name] [name] is an 43 y.o., female MRN:  [id number] DOB:  May 20, 1971 Patient phone:  [phone] (home)  Patient address:   [address]  [place] [address],  Total Time spent with patient: 30 minutes  Date of Admission:  12/24/2013 Date of Discharge: 01/02/14  Reason for Admission:  Psychosis   Discharge Diagnoses: Principal Problem:   Paranoid schizophrenia, chronic condition Active Problems:   Schizophrenia   Alcohol abuse   Psychiatric Specialty Exam: Physical Exam  Review of Systems  Constitutional: Negative.   HENT: Negative.   Eyes: Negative.   Respiratory: Negative.   Cardiovascular: Negative.   Gastrointestinal: Negative.   Genitourinary: Negative.   Musculoskeletal: Negative.   Skin: Negative.   Neurological: Negative.   Endo/Heme/Allergies: Negative.   Psychiatric/Behavioral: Negative for depression, suicidal ideas, hallucinations, memory loss and substance abuse. The patient is nervous/anxious. The patient does not have insomnia.     Blood pressure 98/64, pulse 76, temperature 98.2 F (36.8 C), temperature source Oral, resp. rate 16, height 5' 6.5" (1.689 m), weight 57.607 kg (127 lb), last menstrual period 12/21/2013.Body mass index is 20.19 kg/(m^2).  General Appearance: Fairly Groomed  Patent attorney::  Good  Speech:  Pressured  Volume:  Normal  Mood:  Euthymic  Affect:  Full Range  Thought Process:  Disorganized  Orientation:  Full (Time, Place, and Person)  Thought Content:  Delusions  Suicidal Thoughts:  No  Homicidal Thoughts:  No  Memory:  Immediate;   Fair Recent;   Fair Remote;   Poor  Judgement:  Fair  Insight:  Shallow  Psychomotor Activity:  Increased  Concentration:  Fair  Recall:  [employer] of Knowledge:Poor  Language: Fair  Akathisia:  No  Handed:  Left  AIMS (if indicated):     Assets:  Desire for Improvement Social Support  Sleep:  Number of Hours: 5.75    Past  Psychiatric History: Yes  Diagnosis: Paranoid Schizophrenia   Hospitalizations: Recently at [facility], multiple in [place]   Outpatient Care: [facility]   Substance Abuse Care: Denies   Self-Mutilation:History of cutting but none recently   Suicidal Attempts: None   Violent Behaviors: None    Musculoskeletal: Strength & Muscle Tone: within normal limits Gait & Station: normal Patient leans: Right  DSM5: AXIS I: Chronic Paranoid Schizophrenia  AXIS II: Deferred  AXIS III:  Past Medical History   Diagnosis  Date   AXIS IV: other psychosocial or environmental problems and problems related to social environment  AXIS V: 61-70 mild symptoms   Level of Care:  OP  Hospital Course:  [name] [name] is a 43 year old female who was brought to [facility] under IVC initiated by the patient's mother. Due to her very disorganized thought processes the patient has been unable to provide any relevant information about her current psychiatric state. Her mother was contacted in the ED who reported that her and the patient recently moved from [place] to [place], [place] in 09/2013. [name] [name]'s mother reports that patient has a long history of schizophrenia with multiple hospitalizations in [place]. The patient was recently at [facility] having been released from their facility four days ago. Toniann Fail per mother's report has been uncooperative with follow up running out of [facility]'s office then having to be found with the help of police somewhere in downtown [place]. Patient has been noticed recently sitting in her bedroom talking to people who are not there. Patient is very disorganized during her admission assessment  today stating "Somebody brought me here. I follow the Psychiatric Department. Not because I have any problems but they do and need to support me. My fingerprints could make me a billionaire." The patient constantly mentions the "the Psychiatric Department in [facility]." and makes frequent references to the [facility]. Patient  is noted to be very restless, pacing the hallway and has been observed constantly talking to herself.   Patient had not been taking any psychiatric medication in the days prior to her admission. She had a long history of noncompliance with medications. She was started on Haldol for her psychosis, Artane for EPS prevention, and Tegretol XR for improved mood stability. Her thought processes continued to be disorganized, bizarre, and delusional. Patient attended groups but often made unrelated comments to the focus of what was being discussed. She was encouraged to be started on a long acting injectable medication but she was resistant. Patient's mother also thought this was a good idea but patient was unable to be convinced. She was compliant with her medications during her hospital stay. Her Haldol was increased to 15 mg twice daily to address her paranoia. [name] [name] made many comments about there being a conspiracy to take away her million dollars and felt that she was in the hospital because of her dental problems. The patient did not appear to have any insight into her psychiatric problems. Patient was set up to resume follow up care with [facility]. Patient was provided with prescriptions and medication samples. [name] [name] planned to go back to live with her mother.   Consults:  None  Significant Diagnostic Studies:  labs: Admission labs reviewed and completed.   Discharge Vitals:   Blood pressure 98/64, pulse 76, temperature 98.2 F (36.8 C), temperature source Oral, resp. rate 16, height 5' 6.5" (1.689 m), weight 57.607 kg (127 lb), last menstrual period 12/21/2013. Body mass index is 20.19 kg/(m^2). Lab Results:   Results for orders placed during the hospital encounter of 12/24/13 (from the past 72 hour(s))  CARBAMAZEPINE LEVEL, TOTAL     Status: None   Collection Time    01/02/14  6:25 AM      Result Value Range   Carbamazepine Lvl 5.8  4.0 - 12.0 ug/mL   Comment: Performed at [facility]     Physical Findings: AIMS: Facial and Oral Movements Muscles of Facial Expression: None, normal Lips and Perioral Area: None, normal Jaw: None, normal Tongue: None, normal,Extremity Movements Upper (arms, wrists, hands, fingers): None, normal Lower (legs, knees, ankles, toes): None, normal, Trunk Movements Neck, shoulders, hips: None, normal, Overall Severity Severity of abnormal movements (highest score from questions above): None, normal Incapacitation due to abnormal movements: None, normal Patient's awareness of abnormal movements (rate only patient's report): No Awareness, Dental Status Current problems with teeth and/or dentures?: No Does patient usually wear dentures?: No  CIWA:  CIWA-Ar Total: 3 COWS:     Psychiatric Specialty Exam: See Psychiatric Specialty Exam and Suicide Risk Assessment completed by Attending Physician prior to discharge.  Discharge destination:  Home  Is patient on multiple antipsychotic therapies at discharge:  No   Has Patient had three or more failed trials of antipsychotic monotherapy by history:  No  Recommended Plan for Multiple Antipsychotic Therapies: NA  Discharge Orders   Future Orders Complete By Expires   Discharge instructions  As directed    Comments:     Please follow up with your Dentist for treatment of your dental pain.       Medication List  Indication   acetaminophen 500 MG tablet  Commonly known as:  TYLENOL  Take 1,000 mg by mouth every 6 (six) hours as needed for mild pain.      carbamazepine 200 MG 12 hr tablet  Commonly known as:  TEGRETOL XR  Take 1 tablet (200 mg total) by mouth 2 (two) times daily. For improved mood stability.   Indication:  Manic-Depression     haloperidol 5 MG tablet  Commonly known as:  HALDOL  Take 3 tablets (15 mg total) by mouth 2 (two) times daily.   Indication:  Excessive Use of Alcohol, Psychosis, Schizophrenia     multivitamin with minerals Tabs tablet  Take 1 tablet by  mouth daily. Please purchase over the counter if you wish to continue this treatment.   Indication:  Vitamin Supplementation     traZODone 100 MG tablet  Commonly known as:  DESYREL  Take 1 tablet (100 mg total) by mouth at bedtime.   Indication:  Aggressive Behavior, Alcohol Withdrawal Syndrome, Excessive Use of Alcohol, Trouble Sleeping, Schizophrenia with Depression     trihexyphenidyl 5 MG tablet  Commonly known as:  ARTANE  Take 1.5 tablets (7.5 mg total) by mouth 2 (two) times daily with a meal.   Indication:  Extrapyramidal Reaction caused by Medications           Follow-up Information   Follow up with [facility]. (Go to the walk-in clinic between 8 and 9AM M-F for your hospital follow up appointment)    Contact information:   [address]  [place]  [phone]      Follow up with [facility] On 01/04/2014. (Friday at 1:30  Call them on Thursday to reschedule if you are not able to get a ride for Friday.)    Contact information:   [address]  [place]  [phone]      Follow-up recommendations:   Activity: as tolerated  Diet: healthy  Tests: Tegretol level: 5.8  Other: patient to keep her after care appointment   Comments:  Take all your medications as prescribed by your mental healthcare provider.  Report any adverse effects and or reactions from your medicines to your outpatient provider promptly.  Patient is instructed and cautioned to not engage in alcohol and or illegal drug use while on prescription medicines.  In the event of worsening symptoms, patient is instructed to call the crisis hotline, 911 and or go to the nearest ED for appropriate evaluation and treatment of symptoms.  Follow-up with your primary care provider for your other medical issues, concerns and or health care needs.   Total Discharge Time:  Greater than 30 minutes.  SignedFransisca Kaufmann: DAVIS, LAURA NP-C 01/02/2014, 9:02 AM  Patient seen, evaluated and I agree with notes  by Nurse Practitioner. Thedore MinsMojeed Kerrick Miler, MD

## 2014-01-02 NOTE — [facility] Suicide Risk Assessment (Signed)
   Demographic Factors:  Caucasian, Low socioeconomic status, Unemployed and Female  Total Time spent with patient: 15 minutes  Psychiatric Specialty Exam: Physical Exam  Psychiatric: She has a normal mood and affect. Her behavior is normal. Her speech is rapid and/or pressured. Thought content is paranoid. She expresses impulsivity. She exhibits abnormal recent memory.    Review of Systems  Constitutional: Negative.   HENT: Negative.   Eyes: Negative.   Respiratory: Negative.   Cardiovascular: Negative.   Gastrointestinal: Negative.   Genitourinary: Negative.   Musculoskeletal: Negative.   Skin: Negative.   Neurological: Negative.   Endo/Heme/Allergies: Negative.   Psychiatric/Behavioral: Negative.     Blood pressure 98/64, pulse 76, temperature 98.2 F (36.8 C), temperature source Oral, resp. rate 16, height 5' 6.5" (1.689 m), weight 57.607 kg (127 lb), last menstrual period 12/21/2013.Body mass index is 20.19 kg/(m^2).  General Appearance: Fairly Groomed  Patent attorneyye Contact::  Good  Speech:  Pressured  Volume:  Normal  Mood:  Euthymic  Affect:  Full range  Thought Process:  Disorganized  Orientation:  Full (Time, Place, and Person)  Thought Content:  Delusions  Suicidal Thoughts:  No  Homicidal Thoughts:  No  Memory:  Immediate;   Fair Recent;   Fair Remote;   Poor  Judgement:  marginal  Insight:  Shallow  Psychomotor Activity:  Increased  Concentration:  Fair  Recall:  [employer]Fair  Fund of Knowledge:Poor  Language: Fair  Akathisia:  No  Handed:  Left  AIMS (if indicated):     Assets:  Desire for Improvement Social Support  Sleep:  Number of Hours: 5.75    Musculoskeletal: Strength & Muscle Tone: within normal limits Gait & Station: normal Patient leans: Right   Mental Status Per Nursing Assessment::   On Admission:  NA  Current Mental Status by Physician: patient denies suicidal ideation, intent or plan  Loss Factors: Financial problems/change in  socioeconomic status  Historical Factors: Impulsivity  Risk Reduction Factors:   Living with another person, especially a relative and Positive social support  Continued Clinical Symptoms:  Resolving psychosis and delusions  Cognitive Features That Contribute To Risk:  Closed-mindedness Polarized thinking    Suicide Risk:  Minimal: No identifiable suicidal ideation.  Patients presenting with no risk factors but with morbid ruminations; may be classified as minimal risk based on the severity of the depressive symptoms  Discharge Diagnoses:   AXIS I:  Chronic Paranoid Schizophrenia AXIS II:  Deferred AXIS III:   Past Medical History  Diagnosis Date   AXIS IV:  other psychosocial or environmental problems and problems related to social environment AXIS V:  61-70 mild symptoms  Plan Of Care/Follow-up recommendations:  Activity:  as tolerated Diet:  healthy Tests:  Tegretol level: 5.8 Other:  patient to keep her after care appointment  Is patient on multiple antipsychotic therapies at discharge:  No   Has Patient had three or more failed trials of antipsychotic monotherapy by history:  No  Recommended Plan for Multiple Antipsychotic Therapies: NA    Thedore MinsAkintayo, Koralee Wedeking, MD 01/02/2014, 9:16 AM

## 2014-01-02 NOTE — Progress Notes (Signed)
D/C instructions/meds/follow-up appointments reviewed, pt verbalized understanding, pt's belongings returned to pt, samples given. 

## 2014-01-07 NOTE — Progress Notes (Signed)
Patient Discharge Instructions:  After Visit Summary (AVS):   Faxed to:  01/07/14 Discharge Summary Note:   Faxed to:  01/07/14 Psychiatric Admission Assessment Note:   Faxed to:  01/07/14 Suicide Risk Assessment - Discharge Assessment:   Faxed to:  01/07/14 Faxed/Sent to the Next Level Care provider:  01/07/14 Faxed to [facility] @ [phone] [facility] @ [phone]  Jerelene ReddenSheena E [place], 01/07/2014, 3:40 PM

## 2016-04-22 ENCOUNTER — Emergency Department (HOSPITAL_COMMUNITY)
Admission: EM | Admit: 2016-04-22 | Discharge: 2016-04-23 | Disposition: A | Discharge status: Other Acute Inpatient Hospital | Payer: Medicaid Other | Attending: Emergency Medicine | Admitting: Emergency Medicine

## 2016-04-22 ENCOUNTER — Encounter (HOSPITAL_COMMUNITY): Payer: Self-pay | Admitting: Nurse Practitioner

## 2016-04-22 DIAGNOSIS — R45851 Suicidal ideations: Secondary | ICD-10-CM | POA: Diagnosis not present

## 2016-04-22 DIAGNOSIS — Z79899 Other long term (current) drug therapy: Secondary | ICD-10-CM | POA: Insufficient documentation

## 2016-04-22 DIAGNOSIS — R4182 Altered mental status, unspecified: Secondary | ICD-10-CM | POA: Diagnosis present

## 2016-04-22 DIAGNOSIS — F200 Paranoid schizophrenia: Secondary | ICD-10-CM | POA: Insufficient documentation

## 2016-04-22 DIAGNOSIS — F17210 Nicotine dependence, cigarettes, uncomplicated: Secondary | ICD-10-CM | POA: Diagnosis not present

## 2016-04-22 LAB — CBC WITH DIFFERENTIAL/PLATELET
BASOS ABS: 0 10*3/uL (ref 0.0–0.1)
Basophils Relative: 0 %
EOS PCT: 1 %
Eosinophils Absolute: 0.1 10*3/uL (ref 0.0–0.7)
HEMATOCRIT: 40.7 % (ref 36.0–46.0)
Hemoglobin: 14.5 g/dL (ref 12.0–15.0)
LYMPHS PCT: 21 %
Lymphs Abs: 2.3 10*3/uL (ref 0.7–4.0)
MCH: 30.9 pg (ref 26.0–34.0)
MCHC: 35.6 g/dL (ref 30.0–36.0)
MCV: 86.6 fL (ref 78.0–100.0)
MONOS PCT: 7 %
Monocytes Absolute: 0.7 10*3/uL (ref 0.1–1.0)
NEUTROS ABS: 7.8 10*3/uL — AB (ref 1.7–7.7)
Neutrophils Relative %: 71 %
Platelets: 227 10*3/uL (ref 150–400)
RBC: 4.7 MIL/uL (ref 3.87–5.11)
RDW: 12.6 % (ref 11.5–15.5)
WBC: 10.9 10*3/uL — ABNORMAL HIGH (ref 4.0–10.5)

## 2016-04-22 LAB — COMPREHENSIVE METABOLIC PANEL
ALT: 9 U/L — ABNORMAL LOW (ref 14–54)
AST: 13 U/L — AB (ref 15–41)
Albumin: 4.8 g/dL (ref 3.5–5.0)
Alkaline Phosphatase: 39 U/L (ref 38–126)
Anion gap: 9 (ref 5–15)
BILIRUBIN TOTAL: 0.7 mg/dL (ref 0.3–1.2)
BUN: 9 mg/dL (ref 6–20)
CHLORIDE: 105 mmol/L (ref 101–111)
CO2: 24 mmol/L (ref 22–32)
CREATININE: 0.74 mg/dL (ref 0.44–1.00)
Calcium: 9.8 mg/dL (ref 8.9–10.3)
Glucose, Bld: 94 mg/dL (ref 65–99)
POTASSIUM: 4.5 mmol/L (ref 3.5–5.1)
Sodium: 138 mmol/L (ref 135–145)
Total Protein: 7.8 g/dL (ref 6.5–8.1)

## 2016-04-22 LAB — RAPID URINE DRUG SCREEN, HOSP PERFORMED
Amphetamines: NOT DETECTED
BENZODIAZEPINES: NOT DETECTED
Barbiturates: NOT DETECTED
Cocaine: NOT DETECTED
OPIATES: NOT DETECTED
Tetrahydrocannabinol: NOT DETECTED

## 2016-04-22 LAB — ETHANOL

## 2016-04-22 NOTE — ED Notes (Addendum)
[facility] Patient lives at home with her mother who requested workup for patient. With EMS patient endorsed suicidal ideation. According to mother [facility], came home, and has not left the couch in her house since September of last year. She has been attempting to get the patient help but unsuccessful. The patient presents with incoherent speech, catatonia, poor hygiene, skin breakdown, and malnutrition. Patient perseverates on government services in what speech we can ascertain. Patient's mother to follow EMS.

## 2016-04-22 NOTE — BH Assessment (Addendum)
Assessment Note  [name] [name] is an 45 y.o. female presenting to [facility] voluntarily via EMS with her mother who reports "she isn't feeling good and she is sick" "her teeth is rotting out of her head, she needs medication to realize that she needs healthcare."  Patients mother reports that the patient was previously prescribed Haldol and Cogentin and she feels that the patient should be forced to be placed back on her medications "so that she can realize that I need o take care of her, she won't go to the doctor or take her medications." Patients mother denies that the patient has reported SI or HI but reports "she argues with the voices in her head and she laughs at them sometimes." Patients mother reports that the patient has not taken her medication since 2014 and has a history of not taking her medications unless she is in the hospital. Patients mother reports that the patient has been complaining of stomach pain and she called EMS because she refuses to go to a doctor.  Per chart review patient endorsed SI to EMS.   Patient was assessed alone and her speech was very limited and circumstantial. Patient repeated "it's the government, I'm homeless, the government said I was homeless and that I need to see a sheriff." Patient then talks about the stock market and then repeats "it's the government." Patient is sitting upright in the main ED and rocks back and forth and points her right index finger into her left hand while staring at her index finger. Patient does not make eye contact. Patient denies SI and states that she did not tell EMS that she was suicidal. Patient denies HI. Patient did not answer the remaining questions of the assessment and continued to repeat statements about "the government" and "the stock market." Patients mother was invited back into the room and confirms that the patient has not been suicidal or homicidal to her knowledge. Patients mother's main concern is that patient is unable to  care for herself at this time. Patients mother states that she is not the patients guardian.   Consulted with Maryjean Morn, PA-C who recommends inpatient treatment at this time.   Diagnosis: Schizophrenia  Past Medical History:  Past Medical History  Diagnosis Date  . Schizophrenia (HCC)     only known     History reviewed. No pertinent past surgical history.  Family History: History reviewed. No pertinent family history.  Social History:  reports that she has been smoking Cigarettes.  She has been smoking about 1.00 pack per day. She does not have any smokeless tobacco history on file. She reports that she drinks alcohol. Her drug history is not on file.  Additional Social History:  Alcohol / Drug Use Pain Medications: See PTA Prescriptions: See PTA Over the Counter: See PTA History of alcohol / drug use?: No history of alcohol / drug abuse  CIWA:   COWS:    Allergies:  Allergies  Allergen Reactions  . Sulfa Antibiotics Other (See Comments)    unknown    Home Medications:  (Not in a hospital admission)  OB/GYN Status:  No LMP recorded.  General Assessment Data Location of Assessment: [facility] TTS Assessment: In system Is this a Tele or Face-to-Face Assessment?: Face-to-Face Is this an Initial Assessment or a Re-assessment for this encounter?: Initial Assessment Marital status: Single Is patient pregnant?: No Pregnancy Status: No Living Arrangements: Parent Can pt return to current living arrangement?: Yes Admission Status: Voluntary Is patient capable of  signing voluntary admission?: Yes Referral Source: Self/Family/Friend     Crisis Care Plan Living Arrangements: Parent Name of Psychiatrist: None Name of Therapist: None  Education Status Is patient currently in school?: No  Risk to self with the past 6 months Suicidal Ideation: No Has patient been a risk to self within the past 6 months prior to admission? : No Suicidal Intent: No Has patient had  any suicidal intent within the past 6 months prior to admission? : No Is patient at risk for suicide?: No Suicidal Plan?: No Has patient had any suicidal plan within the past 6 months prior to admission? : No Access to Means: No What has been your use of drugs/alcohol within the last 12 months?: Denies Previous Attempts/Gestures: Yes How many times?: 2 Other Self Harm Risks: Denies Triggers for Past Attempts: Unknown Intentional Self Injurious Behavior: None Family Suicide History: Unknown Recent stressful life event(s):  (UTA) Persecutory voices/beliefs?:  Rich Reining(UTA) Depression:  (UTA) Depression Symptoms:  (UTA) Substance abuse history and/or treatment for substance abuse?: No Suicide prevention information given to non-admitted patients: Not applicable  Risk to Others within the past 6 months Homicidal Ideation: No Does patient have any lifetime risk of violence toward others beyond the six months prior to admission? : No Thoughts of Harm to Others: No Current Homicidal Intent: No Current Homicidal Plan: No Access to Homicidal Means: No Identified Victim: Denies History of harm to others?: No Assessment of Violence: None Noted Violent Behavior Description: Denies Does patient have access to weapons?: No Criminal Charges Pending?: No Does patient have a court date: No Is patient on probation?: No  Psychosis Hallucinations:  (UTA) Delusions:  (UTA)  Mental Status Report Appearance/Hygiene: Bizarre, Body odor, Disheveled, Poor hygiene Eye Contact: Poor Motor Activity: Unable to assess Level of Consciousness: Quiet/awake Mood: Preoccupied Affect: Preoccupied Anxiety Level: Minimal Thought Processes: Circumstantial Judgement: Impaired Orientation: Not oriented Obsessive Compulsive Thoughts/Behaviors: Unable to Assess  Cognitive Functioning Concentration: Unable to Assess Memory: Unable to Assess Insight: Unable to Assess Impulse Control: Unable to Assess Sleep:  Unable to Assess Total Hours of Sleep:  (UTA) Vegetative Symptoms: Unable to Assess  ADLScreening [facility] Assessment Services) Patient's cognitive ability adequate to safely complete daily activities?: Yes Patient able to express need for assistance with ADLs?: Yes Independently performs ADLs?: Yes (appropriate for developmental age)  Prior Inpatient Therapy Prior Inpatient Therapy: Yes Prior Therapy Dates: 2015 [facility] Reason for Treatment: Psychosis  Prior Outpatient Therapy Prior Outpatient Therapy: No Prior Therapy Dates: N/A Prior Therapy Facilty/Provider(s): N/A Reason for Treatment: N/A Does patient have an ACCT team?: No Does patient have Intensive In-House Services?  : No Does patient have [facility] services? : No Does patient have P4CC services?: No  ADL Screening (condition at time of admission) Patient's cognitive ability adequate to safely complete daily activities?: Yes Patient able to express need for assistance with ADLs?: Yes Independently performs ADLs?: Yes (appropriate for developmental age)             Advance Directives (For Healthcare) Does patient have an advance directive?:  (pt unable to answer, UTA) Would patient like information on creating an advanced directive?: No - patient declined information    Additional Information 1:1 In Past 12 Months?: No CIRT Risk: No Elopement Risk: No Does patient have medical clearance?: No     Disposition:  Disposition Initial Assessment Completed for this Encounter: Yes Disposition of Patient: Inpatient treatment program (per Maryjean Mornharles Kober, PA-C) Type of inpatient treatment program: Adult  On Site Evaluation by:   Reviewed with Physician:    Manual Navarra 04/22/2016 10:11 PM

## 2016-04-22 NOTE — BH Assessment (Addendum)
Assessment completed. Consulted with Maryjean Mornharles Kober, PA-C who recommends inpatient treatment once patient is medically cleared.    Davina PokeJoVea Lasonja Lakins, LCSW Therapeutic Triage Specialist [facility] 04/22/2016 8:15 PM

## 2016-04-22 NOTE — ED Notes (Signed)
Lab delay - Was told by RN that getting labs was not going to be possible at the moment.

## 2016-04-22 NOTE — Progress Notes (Signed)
CSW was consulted by TTS to speak with pt and mother due to possible failure to thrive.  CSW met with patient at bedside. Patient was not effectively communicative. There was no family present. Nurse tech was present who states pt has not been communicating effectively since she has been sitting with her.   CSW reached out to mom for collateral information. However, she did not answer the phone. There was no option to leave a message. CSW will continue to follow up.   Per note, inpatient treatment is reccommended for patient.  Willette Brace [phone] ED CSW 04/22/2016 10:49 PM

## 2016-04-23 ENCOUNTER — Inpatient Hospital Stay
Admission: AD | Admit: 2016-04-23 | Discharge: 2016-05-07 | DRG: 885 | Disposition: A | Discharge status: Home / Self Care | Payer: Medicaid Other | Source: Intra-hospital | Attending: Psychiatry | Admitting: Psychiatry

## 2016-04-23 DIAGNOSIS — F200 Paranoid schizophrenia: Secondary | ICD-10-CM

## 2016-04-23 DIAGNOSIS — F17200 Nicotine dependence, unspecified, uncomplicated: Secondary | ICD-10-CM

## 2016-04-23 DIAGNOSIS — F17210 Nicotine dependence, cigarettes, uncomplicated: Secondary | ICD-10-CM | POA: Diagnosis present

## 2016-04-23 DIAGNOSIS — R45851 Suicidal ideations: Secondary | ICD-10-CM

## 2016-04-23 DIAGNOSIS — H5441 Blindness, right eye, normal vision left eye: Secondary | ICD-10-CM | POA: Diagnosis present

## 2016-04-23 DIAGNOSIS — H548 Legal blindness, as defined in USA: Secondary | ICD-10-CM

## 2016-04-23 DIAGNOSIS — Z882 Allergy status to sulfonamides status: Secondary | ICD-10-CM | POA: Diagnosis present

## 2016-04-23 DIAGNOSIS — G4700 Insomnia, unspecified: Secondary | ICD-10-CM | POA: Diagnosis present

## 2016-04-23 MED ORDER — CARBAMAZEPINE ER 200 MG PO TB12
200.0000 mg | ORAL_TABLET | Freq: Two times a day (BID) | ORAL | Status: DC
  Administered 2016-04-23 (×2): 200 mg via ORAL
  Filled 2016-04-23 (×3): qty 1

## 2016-04-23 MED ORDER — ADULT MULTIVITAMIN W/MINERALS CH
1.0000 | ORAL_TABLET | Freq: Every day | ORAL | Status: DC
  Filled 2016-04-23: qty 1

## 2016-04-23 MED ORDER — TRAZODONE HCL 100 MG PO TABS
100.0000 mg | ORAL_TABLET | Freq: Every day | ORAL | Status: DC
  Administered 2016-04-23: 100 mg via ORAL
  Filled 2016-04-23: qty 1

## 2016-04-23 MED ORDER — TRAZODONE HCL 100 MG PO TABS: 100.0000 mg | ORAL_TABLET | Freq: Every evening | ORAL | Status: DC | PRN

## 2016-04-23 MED ORDER — ACETAMINOPHEN 325 MG PO TABS: 650.0000 mg | ORAL_TABLET | Freq: Four times a day (QID) | ORAL | Status: DC | PRN

## 2016-04-23 MED ORDER — TRIHEXYPHENIDYL HCL 5 MG PO TABS
7.5000 mg | ORAL_TABLET | Freq: Two times a day (BID) | ORAL | Status: DC
  Administered 2016-04-24 – 2016-04-27 (×7): 7.5 mg via ORAL
  Filled 2016-04-23 (×7): qty 2

## 2016-04-23 MED ORDER — ADULT MULTIVITAMIN W/MINERALS CH
1.0000 | ORAL_TABLET | Freq: Every day | ORAL | Status: DC
  Administered 2016-04-23: 1 via ORAL
  Filled 2016-04-23: qty 1

## 2016-04-23 MED ORDER — HALOPERIDOL 5 MG PO TABS
15.0000 mg | ORAL_TABLET | Freq: Two times a day (BID) | ORAL | Status: DC
  Administered 2016-04-23 (×2): 15 mg via ORAL
  Filled 2016-04-23 (×2): qty 3

## 2016-04-23 MED ORDER — HALOPERIDOL 5 MG PO TABS
10.0000 mg | ORAL_TABLET | Freq: Two times a day (BID) | ORAL | Status: DC
  Administered 2016-04-23 – 2016-04-24 (×2): 10 mg via ORAL
  Filled 2016-04-23 (×2): qty 2

## 2016-04-23 MED ORDER — TRAZODONE HCL 100 MG PO TABS
100.0000 mg | ORAL_TABLET | Freq: Every day | ORAL | Status: DC
  Administered 2016-04-23 – 2016-04-26 (×4): 100 mg via ORAL
  Filled 2016-04-23 (×4): qty 1

## 2016-04-23 MED ORDER — MAGNESIUM HYDROXIDE 400 MG/5ML PO SUSP: 30.0000 mL | Freq: Every day | ORAL | Status: DC | PRN

## 2016-04-23 MED ORDER — ALUM & MAG HYDROXIDE-SIMETH 200-200-20 MG/5ML PO SUSP: 30.0000 mL | ORAL | Status: DC | PRN

## 2016-04-23 MED ORDER — TRIHEXYPHENIDYL HCL 5 MG PO TABS
7.5000 mg | ORAL_TABLET | Freq: Two times a day (BID) | ORAL | Status: DC
  Administered 2016-04-23 (×2): 7.5 mg via ORAL
  Filled 2016-04-23 (×3): qty 2

## 2016-04-23 NOTE — BH Assessment (Signed)
[facility]  04/23/16: Patient was [facility] Bed [facility] asap.

## 2016-04-23 NOTE — Progress Notes (Addendum)
Pt was brought back from the main ER and is dishelved. Pt talks intermittently. She was awakened to eat breakfast. Pt is cooperative and remains with a sitter. Spoke with [facility] to give report - 12:40pm -They will phone back.

## 2016-04-23 NOTE — BH Assessment (Signed)
[facility] Assessment Progress Note  Per Thedore MinsMojeed Akintayo, MD, this pt requires psychiatric hospitalization at this time.  Per Robinette Hainesalvin Manning, pt has been accepted to [facility], Rm [address] by Dr Ardyth HarpsHernandez.  Nanine MeansJamison Lord, DNP concurs with this decision.  Pt has signed Voluntary Admission and Consent for Treatment, as well as Consent to Release Information to several friends, and signed forms have been faxed to [phone].  Pt's nurse, Diane, has been notified, and agrees to send original paperwork along with pt via [facility], and to call report to [phone].  Doylene Canninghomas Oran Dillenburg, MA Triage Specialist [phone]

## 2016-04-23 NOTE — [facility] Group Notes (Signed)
[facility] Group Notes:  (Nursing/MHT/Case Management/Adjunct)  Date:  04/23/2016  Time:  9:47 PM  Type of Therapy:  Evening Wrap-up Group  Participation Level:  Did Not Attend  Participation Quality:  N/A  Affect:  N/A  Cognitive:  N/A  Insight:  None  Engagement in Group:  N/A  Modes of Intervention:  Discussion  Summary of Progress/Problems:  Tomasita MorrowChelsea Nanta Mihir Flanigan 04/23/2016, 9:47 PM

## 2016-04-23 NOTE — ED Notes (Addendum)
Pt. In burgundy scrubs. Pt. searched and wanded by security. Pt. Had no belongings.

## 2016-04-23 NOTE — Progress Notes (Signed)
[facility], skin/contraband search done, skin to sacrum is red/blanchable, barrier cream applied, removed metal barrett from patient's hair, oriented pt to unit.

## 2016-04-23 NOTE — Tx Team (Signed)
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  The patient and family have been given the opportunity to ask questions and make suggestions.  Ernesto RutherfordKristen Janita Camberos 04/23/2016, 8:55 PM

## 2016-04-23 NOTE — ED Notes (Signed)
Pt was alert, calm and cooperative. She stayed in bed during her stay here. Transported [facility]. Belongings that pt's mother brought were given to [facility] for pt at discharge.

## 2016-04-23 NOTE — Progress Notes (Signed)
Pt extremely disorganized and tangential. Unable to hold a conversation. Denies pain. Will continue to monitor.

## 2016-04-23 NOTE — BH Assessment (Signed)
Patient has been accepted to [facility].  Accepting physician is Dr. Ardyth HarpsHernandez.  Attending Physician will be Dr. Ardyth HarpsHernandez.  Patient has been assigned to room 310, [facility] Charge Nurse Sue LushAndrea.  Call report to [phone].  Representative/Transfer Coordinator is Nicola Girtalvin  [facility] ER Staff Maisie Fus([place], TTS) made aware of acceptance. [facility] Staff Inetta Fermo([facility] made aware of acceptance

## 2016-04-23 NOTE — ED Notes (Signed)
Bed: [id number] Expected date:  Expected time:  Means of arrival:  Comments: RES A

## 2016-04-23 NOTE — Consult Note (Signed)
[facility] Consult   Reason for Consult:  Suicidal ideations with severe depression, history of schizophrenia Referring Physician:  EDP Patient Identification: [name] [name] MRN:  [id number] Principal Diagnosis: Paranoid schizophrenia, chronic condition ([place]) Diagnosis:   Patient Active Problem List   Diagnosis Date Noted  . Paranoid schizophrenia, chronic condition ([place]) [F20.0] 12/25/2013    Priority: High  . Alcohol abuse [F10.10] 12/25/2013  . Schizophrenia ([place]) [F20.9] 12/24/2013    Total Time spent with patient: 45 minutes  Subjective:   [name] [name] is a 45 y.o. female patient admitted with exacerbation of schizophrenia.  HPI:  45 yo female who presented to the ED with severe depression and delusions.  She has been lying on her mother's couch since September almost in a catatonic state.  Delusions about the government with paranoia.  Minimal verbal interaction.  She did take a shower today but went right back to lying in bed.  Past Psychiatric History: schizophrenia  Risk to Self: Suicidal Ideation: No Suicidal Intent: No Is patient at risk for suicide?: No Suicidal Plan?: No Access to Means: No What has been your use of drugs/alcohol within the last 12 months?: Denies How many times?: 2 Other Self Harm Risks: Denies Triggers for Past Attempts: Unknown Intentional Self Injurious Behavior: None Risk to Others: Homicidal Ideation: No Thoughts of Harm to Others: No Current Homicidal Intent: No Current Homicidal Plan: No Access to Homicidal Means: No Identified Victim: Denies History of harm to others?: No Assessment of Violence: None Noted Violent Behavior Description: Denies Does patient have access to weapons?: No Criminal Charges Pending?: No Does patient have a court date: No Prior Inpatient Therapy: Prior Inpatient Therapy: Yes Prior Therapy Dates: 2015 Prior Therapy Facilty/Provider(s): [facility] Reason for Treatment: Psychosis Prior Outpatient  Therapy: Prior Outpatient Therapy: No Prior Therapy Dates: N/A Prior Therapy Facilty/Provider(s): N/A Reason for Treatment: N/A Does patient have an ACCT team?: No Does patient have Intensive In-House Services?  : No Does patient have [facility] services? : No Does patient have [facility] services?: No  Past Medical History:  Past Medical History  Diagnosis Date  . Schizophrenia ([place])     only known    History reviewed. No pertinent past surgical history. Family History: History reviewed. No pertinent family history. Family Psychiatric  History: none Social History:  History  Alcohol Use  . 0.0 oz/week  . 1-2 Cans of beer per week    Comment: unknown - 04/22/16     History  Drug Use Not on file    Social History   Social History  . Marital Status: Single    Spouse Name: N/A  . Number of Children: N/A  . Years of Education: N/A   Social History Main Topics  . Smoking status: Current Every Day Smoker -- 1.00 packs/day    Types: Cigarettes  . Smokeless tobacco: None  . Alcohol Use: 0.0 oz/week    1-2 Cans of beer per week     Comment: unknown - 04/22/16  . Drug Use: None  . Sexual Activity: Not Asked   Other Topics Concern  . None   Social History Narrative   Additional Social History:    Allergies:   Allergies  Allergen Reactions  . Sulfa Antibiotics Other (See Comments)    unknown    Labs:  Results for orders placed or performed during the hospital encounter of 04/22/16 (from the past 48 hour(s))  CBC with Differential/Platelet     Status: Abnormal   Collection Time: 04/22/16  8:42 PM  Result Value Ref Range   WBC 10.9 (H) 4.0 - 10.5 K/uL   RBC 4.70 3.87 - 5.11 MIL/uL   Hemoglobin 14.5 12.0 - 15.0 g/dL   HCT 40.7 36.0 - 46.0 %   MCV 86.6 78.0 - 100.0 fL   MCH 30.9 26.0 - 34.0 pg   MCHC 35.6 30.0 - 36.0 g/dL   RDW 12.6 11.5 - 15.5 %   Platelets 227 150 - 400 K/uL   Neutrophils Relative % 71 %   Neutro Abs 7.8 (H) 1.7 - 7.7 K/uL   Lymphocytes Relative 21  %   Lymphs Abs 2.3 0.7 - 4.0 K/uL   Monocytes Relative 7 %   Monocytes Absolute 0.7 0.1 - 1.0 K/uL   Eosinophils Relative 1 %   Eosinophils Absolute 0.1 0.0 - 0.7 K/uL   Basophils Relative 0 %   Basophils Absolute 0.0 0.0 - 0.1 K/uL  Comprehensive metabolic panel     Status: Abnormal   Collection Time: 04/22/16  8:42 PM  Result Value Ref Range   Sodium 138 135 - 145 mmol/L   Potassium 4.5 3.5 - 5.1 mmol/L   Chloride 105 101 - 111 mmol/L   CO2 24 22 - 32 mmol/L   Glucose, Bld 94 65 - 99 mg/dL   BUN 9 6 - 20 mg/dL   Creatinine, Ser 0.74 0.44 - 1.00 mg/dL   Calcium 9.8 8.9 - 10.3 mg/dL   Total Protein 7.8 6.5 - 8.1 g/dL   Albumin 4.8 3.5 - 5.0 g/dL   AST 13 (L) 15 - 41 U/L   ALT 9 (L) 14 - 54 U/L   Alkaline Phosphatase 39 38 - 126 U/L   Total Bilirubin 0.7 0.3 - 1.2 mg/dL   GFR calc non Af Amer >60 >60 mL/min   GFR calc Af Amer >60 >60 mL/min    Comment: (NOTE) The eGFR has been calculated using the CKD EPI equation. This calculation has not been validated in all clinical situations. eGFR's persistently <60 mL/min signify possible Chronic Kidney Disease.    Anion gap 9 5 - 15  Ethanol     Status: None   Collection Time: 04/22/16  8:42 PM  Result Value Ref Range   Alcohol, Ethyl (B) <5 <5 mg/dL    Comment:        LOWEST DETECTABLE LIMIT FOR SERUM ALCOHOL IS 5 mg/dL FOR MEDICAL PURPOSES ONLY   Urine rapid drug screen (hosp performed)     Status: None   Collection Time: 04/22/16  8:52 PM  Result Value Ref Range   Opiates NONE DETECTED NONE DETECTED   Cocaine NONE DETECTED NONE DETECTED   Benzodiazepines NONE DETECTED NONE DETECTED   Amphetamines NONE DETECTED NONE DETECTED   Tetrahydrocannabinol NONE DETECTED NONE DETECTED   Barbiturates NONE DETECTED NONE DETECTED    Comment:        DRUG SCREEN FOR MEDICAL PURPOSES ONLY.  IF CONFIRMATION IS NEEDED FOR ANY PURPOSE, NOTIFY LAB WITHIN 5 DAYS.        LOWEST DETECTABLE LIMITS FOR URINE DRUG SCREEN Drug Class        Cutoff (ng/mL) Amphetamine      1000 Barbiturate      200 Benzodiazepine   248 Tricyclics       250 Opiates          300 Cocaine          300 THC  50     Current Facility-Administered Medications  Medication Dose Route Frequency Provider Last Rate Last Dose  . carbamazepine (TEGRETOL XR) 12 hr tablet 200 mg  200 mg Oral BID Milton Ferguson, MD   200 mg at 04/23/16 0950  . haloperidol (HALDOL) tablet 15 mg  15 mg Oral BID Milton Ferguson, MD   15 mg at 04/23/16 0950  . multivitamin with minerals tablet 1 tablet  1 tablet Oral Daily Milton Ferguson, MD   1 tablet at 04/23/16 0950  . traZODone (DESYREL) tablet 100 mg  100 mg Oral QHS Milton Ferguson, MD   100 mg at 04/23/16 0138  . trihexyphenidyl (ARTANE) tablet 7.5 mg  7.5 mg Oral BID WC Milton Ferguson, MD   7.5 mg at 04/23/16 5916   Current Outpatient Prescriptions  Medication Sig Dispense Refill  . carbamazepine (TEGRETOL XR) 200 MG 12 hr tablet Take 1 tablet (200 mg total) by mouth 2 (two) times daily. For improved mood stability. (Patient not taking: Reported on 04/22/2016) 60 tablet 0  . haloperidol (HALDOL) 5 MG tablet Take 3 tablets (15 mg total) by mouth 2 (two) times daily. (Patient not taking: Reported on 04/22/2016) 180 tablet 0  . Multiple Vitamin (MULTIVITAMIN WITH MINERALS) TABS tablet Take 1 tablet by mouth daily. Please purchase over the counter if you wish to continue this treatment. (Patient not taking: Reported on 04/22/2016)    . traZODone (DESYREL) 100 MG tablet Take 1 tablet (100 mg total) by mouth at bedtime. (Patient not taking: Reported on 04/22/2016) 30 tablet 0  . trihexyphenidyl (ARTANE) 5 MG tablet Take 1.5 tablets (7.5 mg total) by mouth 2 (two) times daily with a meal. (Patient not taking: Reported on 04/22/2016) 90 tablet 0    Musculoskeletal: Strength & Muscle Tone: decreased Gait & Station: normal Patient leans: N/A  Psychiatric Specialty Exam: Physical Exam  Constitutional: She is oriented to person,  place, and time. She appears well-developed and well-nourished.  HENT:  Head: Normocephalic.  Neck: Normal range of motion.  Respiratory: Effort normal.  Musculoskeletal: Normal range of motion.  Neurological: She is alert and oriented to person, place, and time.  Skin: Skin is warm and dry.  Psychiatric: Her speech is delayed. She is slowed. Cognition and memory are impaired. She expresses inappropriate judgment. She exhibits a depressed mood. She expresses suicidal ideation.    Review of Systems  Constitutional: Negative.   HENT: Negative.   Eyes: Negative.   Respiratory: Negative.   Cardiovascular: Negative.   Gastrointestinal: Negative.   Genitourinary: Negative.   Musculoskeletal: Negative.   Skin: Negative.   Neurological: Negative.   Endo/Heme/Allergies: Negative.   Psychiatric/Behavioral: Positive for depression, suicidal ideas and hallucinations.    Blood pressure 100/44, pulse 76, temperature 99.2 F (37.3 C), temperature source Oral, resp. rate 18, SpO2 95 %.There is no weight on file to calculate BMI.  General Appearance: Disheveled  Eye Contact:  Minimal  Speech:  Normal Rate  Volume:  Decreased  Mood:  Depressed and Dysphoric  Affect:  Flat  Thought Process:  Disorganized  Orientation:  Other:  self  Thought Content:  Delusions and Hallucinations: Auditory Visual  Suicidal Thoughts:  Yes.  with intent/plan  Homicidal Thoughts:  No  Memory:  Immediate;   Poor Recent;   Poor Remote;   Poor  Judgement:  Impaired  Insight:  Lacking  Psychomotor Activity:  Decreased  Concentration:  Concentration: Poor and Attention Span: Poor  Recall:  Poor  Fund of Knowledge:  Fair  Language:  Poor  Akathisia:  No  Handed:  Right  AIMS (if indicated):     Assets:  Leisure Time Resilience Social Support  ADL's:  Intact  Cognition:  Impaired,  Moderate  Sleep:        Treatment Plan Summary: Daily contact with patient to assess and evaluate symptoms and progress in  treatment, Medication management and Plan schizophrenia, paranoid type  -Crisis stabilization -Medication management:  Started home medications--Haldol 15 mg BID for psychosis, Tegretol 200 mg BID for mood stabilization, Trazodone 100 mg at bedtime for sleep, and Artane 7.5 mg BID for for EPS. -Individual counseling  Disposition: Recommend psychiatric Inpatient admission when medically cleared.  Waylan Boga, NP 04/23/2016 2:47 PM Patient seen face-to-face for psychiatric evaluation, chart reviewed and case discussed with the physician extender and developed treatment plan. Reviewed the information documented and agree with the treatment plan. Corena Pilgrim, MD

## 2016-04-23 NOTE — ED Notes (Signed)
Pt would not cooperative with a full-body skin assessment. She did not complain of any pain and denied having wounds. She showed her arms and appeared to have dry skin and redness. There is redness on her face and neck. Her hair appears dirty and there is dandruff on the top of her scalp. Close examination did not reveal any lice. Pt denied itching or pain. This Clinical research associatewriter asked her to take a shower and she said that she just had one. This Clinical research associatewriter asked her previous nurse who verified that she had a shower and washed her hair prior to admission to the unit. Pt's speech is nonsensical and except for communication about basic things is nonsensical. She is very thin and she refused offerings of food or drink at this time. Staff report that she ate breakfast and lunch and that she gave herself a shower.

## 2016-04-23 NOTE — ED Provider Notes (Signed)
CSN: [id number]     Arrival date & time 04/22/16  1824 History   First MD Initiated Contact with Patient 04/22/16 1843     Chief Complaint  Patient presents with  . Paranoid  . Hallucinations  . Altered Mental Status  . Suicidal     (Consider location/radiation/quality/duration/timing/severity/associated sxs/prior Treatment) Patient is a 45 y.o. female presenting with altered mental status. The history is provided by a parent (The mother states patient has schizophrenia and has not been taking her medicine service very long time she's been hearing voices).  Altered Mental Status Presenting symptoms: behavior changes   Severity:  Severe Most recent episode:  More than 2 days ago Episode history:  Multiple Timing:  Constant Progression:  Worsening Chronicity:  Recurrent Context: not alcohol use   Associated symptoms: no abdominal pain, no hallucinations, no headaches, no rash and no seizures     Past Medical History  Diagnosis Date  . Schizophrenia ([facility])     only known    History reviewed. No pertinent past surgical history. History reviewed. No pertinent family history. Social History  Substance Use Topics  . Smoking status: Current Every Day Smoker -- 1.00 packs/day    Types: Cigarettes  . Smokeless tobacco: None  . Alcohol Use: 0.0 oz/week    1-2 Cans of beer per week     Comment: unknown - 04/22/16   OB History    No data available     Review of Systems  Constitutional: Negative for appetite change and fatigue.  HENT: Negative for congestion, ear discharge and sinus pressure.   Eyes: Negative for discharge.  Respiratory: Negative for cough.   Cardiovascular: Negative for chest pain.  Gastrointestinal: Negative for abdominal pain and diarrhea.  Genitourinary: Negative for frequency and hematuria.  Musculoskeletal: Negative for back pain.  Skin: Negative for rash.  Neurological: Negative for seizures and headaches.  Psychiatric/Behavioral: Negative for  hallucinations.      Allergies  Sulfa antibiotics  Home Medications   Prior to Admission medications   Medication Sig Start Date End Date Taking? Authorizing Provider  carbamazepine (TEGRETOL XR) 200 MG 12 hr tablet Take 1 tablet (200 mg total) by mouth 2 (two) times daily. For improved mood stability. Patient not taking: Reported on 04/22/2016 01/02/14   Thermon LeylandLaura A Davis, NP  haloperidol (HALDOL) 5 MG tablet Take 3 tablets (15 mg total) by mouth 2 (two) times daily. Patient not taking: Reported on 04/22/2016 01/02/14   Thermon LeylandLaura A Davis, NP  Multiple Vitamin (MULTIVITAMIN WITH MINERALS) TABS tablet Take 1 tablet by mouth daily. Please purchase over the counter if you wish to continue this treatment. Patient not taking: Reported on 04/22/2016 01/02/14   Thermon LeylandLaura A Davis, NP  traZODone (DESYREL) 100 MG tablet Take 1 tablet (100 mg total) by mouth at bedtime. Patient not taking: Reported on 04/22/2016 01/02/14   Thermon LeylandLaura A Davis, NP  trihexyphenidyl (ARTANE) 5 MG tablet Take 1.5 tablets (7.5 mg total) by mouth 2 (two) times daily with a meal. Patient not taking: Reported on 04/22/2016 01/02/14   Thermon LeylandLaura A Davis, NP   BP 129/78 mmHg  Pulse 78  Temp(Src) 98.6 F (37 C) (Oral)  Resp 24  SpO2 100% Physical Exam  Constitutional: She appears well-developed.  HENT:  Head: Normocephalic.  Eyes: Conjunctivae and EOM are normal. No scleral icterus.  Neck: Neck supple. No thyromegaly present.  Cardiovascular: Normal rate and regular rhythm.  Exam reveals no gallop and no friction rub.   No murmur heard. Pulmonary/Chest:  No stridor. She has no wheezes. She has no rales. She exhibits no tenderness.  Abdominal: She exhibits no distension. There is no tenderness. There is no rebound.  Musculoskeletal: Normal range of motion. She exhibits no edema.  Lymphadenopathy:    She has no cervical adenopathy.  Neurological: She is alert. She exhibits normal muscle tone. Coordination normal.  Patient alert oriented to person only  she is presently having auditory hallucinations  Skin: No rash noted. No erythema.    ED Course  Procedures (including critical care time) Labs Review Labs Reviewed  CBC WITH DIFFERENTIAL/PLATELET - Abnormal; Notable for the following:    WBC 10.9 (*)    Neutro Abs 7.8 (*)    All other components within normal limits  COMPREHENSIVE METABOLIC PANEL - Abnormal; Notable for the following:    AST 13 (*)    ALT 9 (*)    All other components within normal limits  ETHANOL  URINE RAPID DRUG SCREEN, HOSP PERFORMED    Imaging Review No results found. I have personally reviewed and evaluated these images and lab results as part of my medical decision-making.   EKG Interpretation None      MDM   Final diagnoses:  None    Patient is medically cleared she has schizophrenia with auditory hallucinations. She will be admitted to psych    Bethann Berkshire, MD 04/23/16 [phone]

## 2016-04-24 DIAGNOSIS — F200 Paranoid schizophrenia: Principal | ICD-10-CM

## 2016-04-24 MED ORDER — ENSURE ENLIVE PO LIQD
237.0000 mL | Freq: Two times a day (BID) | ORAL | Status: DC
  Administered 2016-04-24 – 2016-04-27 (×7): 237 mL via ORAL

## 2016-04-24 MED ORDER — ADULT MULTIVITAMIN LIQUID CH
15.0000 mL | Freq: Every day | ORAL | Status: DC
  Administered 2016-04-24 – 2016-05-07 (×12): 15 mL via ORAL
  Filled 2016-04-24 (×15): qty 15

## 2016-04-24 MED ORDER — HALOPERIDOL 5 MG PO TABS
12.5000 mg | ORAL_TABLET | Freq: Two times a day (BID) | ORAL | Status: DC
  Administered 2016-04-24 – 2016-04-25 (×2): 12.5 mg via ORAL
  Filled 2016-04-24 (×2): qty 2

## 2016-04-24 NOTE — Progress Notes (Signed)
"  I was in a murder trailer, am telling you, that's why I didn't want to go back there. The government should have known it."

## 2016-04-24 NOTE — H&P (Signed)
Psychiatric Admission Assessment Adult  Patient Identification: [name] [name] MRN:  [id number] Date of Evaluation:  04/24/2016 Chief Complaint:  schizophrena Principal Diagnosis: <principal problem not specified> Diagnosis:   Patient Active Problem List   Diagnosis Date Noted  . Psychosis [F29] 04/23/2016  . Paranoid schizophrenia, chronic condition ([place]) [F20.0] 12/25/2013  . Alcohol abuse [F10.10] 12/25/2013  . Schizophrenia ([place]) [F20.9] 12/24/2013   History of Present Illness: 41 year with schizophrenia.  She was disorganized and difficult to follow during the interview.  Patient was paranoid and had delusions about the government and phillip Morris.. While walking to the room the patient exclaimed that she was blind and needed some glasses from [employer]. Of note, she had no difficulty navigating the unit however.   She began the interview by stating that " I was in a trailer that was becoming a killer. So I called department of [place]... The sheriff told me to go to the emergency room. The sheriff's department is secret service. Invented because of robbery."     When asked why she was in the hospital she replied,  "I'm lacking to see but for some odd cause weapons were invented. gotta get them from [place] [facility], until they remove the failure of being able to work at [employer]."   Patient states that she slept well. When asked about SI/HI/AVH, she replied that she has " no Thoughts about the 14th president of the universe. Nursing was invented because I don't like doctors. If a doctor came near me I would go off! " She states that she did shower this morning but did not eat breakfast.       Per [facility] assessment: Assessment Note  [name] [name] is an 45 y.o. female presenting to [place] [facility] voluntarily via EMS with her mother who reports "she isn't feeling good and she is sick" "her teeth is rotting out of her head, she needs medication to realize that she needs healthcare." Patients mother reports  that the patient was previously prescribed Haldol and Cogentin and she feels that the patient should be forced to be placed back on her medications "so that she can realize that I need o take care of her, she won't go to the doctor or take her medications." Patients mother denies that the patient has reported SI or HI but reports "she argues with the voices in her head and she laughs at them sometimes." Patients mother reports that the patient has not taken her medication since 2014 and has a history of not taking her medications unless she is in the hospital. Patients mother reports that the patient has been complaining of stomach pain and she called EMS because she refuses to go to a doctor. Per chart review patient endorsed SI to EMS.   Patient was assessed alone and her speech was very limited and circumstantial. Patient repeated "it's the government, I'm homeless, the government said I was homeless and that I need to see a sheriff." Patient then talks about the stock market and then repeats "it's the government." Patient is sitting upright in the main ED and rocks back and forth and points her right index finger into her left hand while staring at her index finger. Patient does not make eye contact. Patient denies SI and states that she did not tell EMS that she was suicidal. Patient denies HI. Patient did not answer the remaining questions of the assessment and continued to repeat statements about "the government" and "the stock market." Patients mother was invited back  into the room and confirms that the patient has not been suicidal or homicidal to her knowledge. Patients mother's main concern is that patient is unable to care for herself at this time. Patients mother states that she is not the patients guardian.   Associated Signs/Symptoms: Depression Symptoms:  patient denies (Hypo) Manic Symptoms:  Delusions, Distractibility, Flight of Ideas, Impulsivity, Labiality of Mood, Anxiety Symptoms:   denies Psychotic Symptoms:  Delusions, Hallucinations: appears to be responding to internal stimuli Ideas of Reference, Paranoia, PTSD Symptoms: NA Total Time spent with patient: 1 hour  Past Psychiatric History: schizophrenia  Is the patient at risk to self? No.  Has the patient been a risk to self in the past 6 months? No.  Has the patient been a risk to self within the distant past? No.  Is the patient a risk to others? No.  Has the patient been a risk to others in the past 6 months? No.  Has the patient been a risk to others within the distant past? No.   NO SI/HI  Prior Inpatient Therapy:   Prior Outpatient Therapy:    Alcohol Screening: 1. How often do you have a drink containing alcohol?: Never 2. How many drinks containing alcohol do you have on a typical day when you are drinking?: 1 or 2 3. How often do you have six or more drinks on one occasion?: Never Preliminary Score: 0 4. How often during the last year have you found that you were not able to stop drinking once you had started?: Never 5. How often during the last year have you failed to do what was normally expected from you becasue of drinking?: Never 6. How often during the last year have you needed a first drink in the morning to get yourself going after a heavy drinking session?: Never 7. How often during the last year have you had a feeling of guilt of remorse after drinking?: Never 8. How often during the last year have you been unable to remember what happened the night before because you had been drinking?: Never 9. Have you or someone else been injured as a result of your drinking?: No 10. Has a relative or friend or a doctor or another health worker been concerned about your drinking or suggested you cut down?: No Alcohol Use Disorder Identification Test Final Score (AUDIT): 0 Brief Intervention: AUDIT score less than 7 or less-screening does not suggest unhealthy drinking-brief intervention not  indicated Substance Abuse History in the last 12 months:  No. Consequences of Substance Abuse: Negative Previous Psychotropic Medications: No  Psychological Evaluations: Yes  Past Medical History:  Past Medical History  Diagnosis Date  . Schizophrenia ([place])     only known    History reviewed. No pertinent past surgical history. Family History: History reviewed. No pertinent family history. Family Psychiatric  History: none Tobacco Screening: '@FLOW'$ ([phone])::1)@ Social History:  History  Alcohol Use  . 0.0 oz/week  . 1-2 Cans of beer per week    Comment: unknown - 04/22/16     History  Drug Use Not on file    Additional Social History:                           Allergies:   Allergies  Allergen Reactions  . Sulfa Antibiotics Other (See Comments)    unknown   Lab Results:  Results for orders placed or performed during the hospital encounter of 04/22/16 (from the past  48 hour(s))  CBC with Differential/Platelet     Status: Abnormal   Collection Time: 04/22/16  8:42 PM  Result Value Ref Range   WBC 10.9 (H) 4.0 - 10.5 K/uL   RBC 4.70 3.87 - 5.11 MIL/uL   Hemoglobin 14.5 12.0 - 15.0 g/dL   HCT 40.7 36.0 - 46.0 %   MCV 86.6 78.0 - 100.0 fL   MCH 30.9 26.0 - 34.0 pg   MCHC 35.6 30.0 - 36.0 g/dL   RDW 12.6 11.5 - 15.5 %   Platelets 227 150 - 400 K/uL   Neutrophils Relative % 71 %   Neutro Abs 7.8 (H) 1.7 - 7.7 K/uL   Lymphocytes Relative 21 %   Lymphs Abs 2.3 0.7 - 4.0 K/uL   Monocytes Relative 7 %   Monocytes Absolute 0.7 0.1 - 1.0 K/uL   Eosinophils Relative 1 %   Eosinophils Absolute 0.1 0.0 - 0.7 K/uL   Basophils Relative 0 %   Basophils Absolute 0.0 0.0 - 0.1 K/uL  Comprehensive metabolic panel     Status: Abnormal   Collection Time: 04/22/16  8:42 PM  Result Value Ref Range   Sodium 138 135 - 145 mmol/L   Potassium 4.5 3.5 - 5.1 mmol/L   Chloride 105 101 - 111 mmol/L   CO2 24 22 - 32 mmol/L   Glucose, Bld 94 65 - 99 mg/dL   BUN 9 6 - 20  mg/dL   Creatinine, Ser 0.74 0.44 - 1.00 mg/dL   Calcium 9.8 8.9 - 10.3 mg/dL   Total Protein 7.8 6.5 - 8.1 g/dL   Albumin 4.8 3.5 - 5.0 g/dL   AST 13 (L) 15 - 41 U/L   ALT 9 (L) 14 - 54 U/L   Alkaline Phosphatase 39 38 - 126 U/L   Total Bilirubin 0.7 0.3 - 1.2 mg/dL   GFR calc non Af Amer >60 >60 mL/min   GFR calc Af Amer >60 >60 mL/min    Comment: (NOTE) The eGFR has been calculated using the CKD EPI equation. This calculation has not been validated in all clinical situations. eGFR's persistently <60 mL/min signify possible Chronic Kidney Disease.    Anion gap 9 5 - 15  Ethanol     Status: None   Collection Time: 04/22/16  8:42 PM  Result Value Ref Range   Alcohol, Ethyl (B) <5 <5 mg/dL    Comment:        LOWEST DETECTABLE LIMIT FOR SERUM ALCOHOL IS 5 mg/dL FOR MEDICAL PURPOSES ONLY   Urine rapid drug screen (hosp performed)     Status: None   Collection Time: 04/22/16  8:52 PM  Result Value Ref Range   Opiates NONE DETECTED NONE DETECTED   Cocaine NONE DETECTED NONE DETECTED   Benzodiazepines NONE DETECTED NONE DETECTED   Amphetamines NONE DETECTED NONE DETECTED   Tetrahydrocannabinol NONE DETECTED NONE DETECTED   Barbiturates NONE DETECTED NONE DETECTED    Comment:        DRUG SCREEN FOR MEDICAL PURPOSES ONLY.  IF CONFIRMATION IS NEEDED FOR ANY PURPOSE, NOTIFY LAB WITHIN 5 DAYS.        LOWEST DETECTABLE LIMITS FOR URINE DRUG SCREEN Drug Class       Cutoff (ng/mL) Amphetamine      1000 Barbiturate      200 Benzodiazepine   277 Tricyclics       824 Opiates          300 Cocaine  300 THC              50     Blood Alcohol level:  Lab Results  Component Value Date   ETH <5 04/22/2016   ETH 15* 05/28/1600    Metabolic Disorder Labs:  No results found for: HGBA1C, MPG No results found for: PROLACTIN No results found for: CHOL, TRIG, HDL, CHOLHDL, VLDL, LDLCALC  Current Medications: Current Facility-Administered Medications  Medication Dose  Route Frequency Provider Last Rate Last Dose  . acetaminophen (TYLENOL) tablet 650 mg  650 mg Oral Q6H PRN Hildred Priest, MD      . alum & mag hydroxide-simeth (MAALOX/MYLANTA) 200-200-20 MG/5ML suspension 30 mL  30 mL Oral Q4H PRN Hildred Priest, MD      . haloperidol (HALDOL) tablet 10 mg  10 mg Oral BID Hildred Priest, MD   10 mg at 04/24/16 0855  . magnesium hydroxide (MILK OF MAGNESIA) suspension 30 mL  30 mL Oral Daily PRN Hildred Priest, MD      . multivitamin with minerals tablet 1 tablet  1 tablet Oral Daily Hildred Priest, MD   1 tablet at 04/24/16 0855  . traZODone (DESYREL) tablet 100 mg  100 mg Oral QHS PRN Hildred Priest, MD      . traZODone (DESYREL) tablet 100 mg  100 mg Oral QHS Hildred Priest, MD   100 mg at 04/23/16 2153  . trihexyphenidyl (ARTANE) tablet 7.5 mg  7.5 mg Oral BID WC Lenis Noon, RPH   7.5 mg at 04/24/16 0932   PTA Medications: No prescriptions prior to admission    Musculoskeletal: Strength & Muscle Tone: within normal limits Gait & Station: normal Patient leans: N/A  Psychiatric Specialty Exam: Physical Exam Constitutional: She is oriented to person, place, and time. She appears well-developed and well-nourished.  HENT:  Head: Normocephalic.  Neck: Normal range of motion.  Respiratory: Effort normal.  Musculoskeletal: Normal range of motion.  Neurological: She is alert and oriented to person, place, and time.  Skin: Skin is warm and dry.  Psychiatric: Her speech is delayed. She is slowed. Cognition and memory are impaired. She expresses inappropriate judgment. She exhibits a depressed mood. She expresses suicidal ideation.   ROS Constitutional: Negative.  HENT: Negative.  Eyes: Negative.  Respiratory: Negative.  Cardiovascular: Negative.  Gastrointestinal: Negative.  Genitourinary: Negative.  Musculoskeletal: Negative.  Skin: Negative.  Neurological:  Negative.  Endo/Heme/Allergies: Negative.  Psychiatric/Behavioral: Positive for depression, suicidal ideas and hallucinations.    Blood pressure 83/46, pulse 81, temperature 99 F (37.2 C), temperature source Oral, resp. rate 18, height 5' 7.5" (1.715 m), weight 43.999 kg (97 lb), SpO2 99 %.Body mass index is 14.96 kg/(m^2).  General Appearance: Bizarre and Disheveled  Eye Contact:  Poor  Speech:  Normal Rate  Volume:  Decreased  Mood:  Depressed and Dysphoric  Affect:  Flat  Thought Process:  Disorganized  Orientation:  Other:  self  Thought Content:  Hallucinations: Auditory Visual  Suicidal Thoughts:  Yes.  with intent/plan though she denied today  Homicidal Thoughts:  No  Memory:  Immediate;   Poor Recent;   Poor Remote;   Poor  Judgement:  Impaired  Insight:  Lacking and Shallow  Psychomotor Activity:  Decreased  Concentration:  Concentration: Poor  Recall:  Poor  Fund of Knowledge:  Fair  Language:  Poor  Akathisia:  No  Handed:  Right  AIMS (if indicated):     Assets:  Leisure Time Resilience Social Support  ADL's:  Impaired  Cognition:  Impaired,  Moderate  Sleep:          Treatment Plan Summary: Daily contact with patient to assess and evaluate symptoms and progress in treatment and Medication management   schizophrenia, paranoid type  -Crisis stabilization -Medication management: Started home medications--Haldol 12.5 mg BID for psychosis, Tegretol 200 mg BID for mood stabilization, Trazodone 100 mg at bedtime for sleep, and Artane 7.5 mg BID for for EPS. -Individual counseling  Observation Level/Precautions:  Elopement 15 minute checks  Laboratory:  as ordered  Psychotherapy:  Milieu and group  Medications:  As mentioned above  Consultations:    Discharge Concerns:  f/u  Estimated LOS: 5-7 days   Other:     I certify that inpatient services furnished can reasonably be expected to improve the patient's condition.    Jim Like,  MD 5/27/20179:12 AM

## 2016-04-24 NOTE — Progress Notes (Signed)
Patient observed to be responding to internal stimuli, would be seen walking down the hallways/in her room and in the dayroom talking to herself and with hand gestures. Patient took a shower but still maintains a body odor. She is paranoid, irritable, bizarre, disoriented, delusional, +ve AH/VH and with very minimal interaction with peers. Patient took all her medications except the Multivitamin. She stated that she wants liquid vitamin. MD notified. Will continue to reorient patient as needed.

## 2016-04-24 NOTE — [facility] Suicide Risk Assessment (Signed)
[facility]  Nursing information obtained from:   nursing , patient, review of record Demographic factors:   female Current Mental Status:   disorganized Loss Factors:    loss of health Historical Factors:   NA  Risk Reduction Factors:    living with another person, desire to feel better  Total Time spent with patient: 1 hour Principal Problem: <principal problem not specified> schizophrenia Diagnosis:   Patient Active Problem List   Diagnosis Date Noted  . Psychosis [F29] 04/23/2016  . Paranoid schizophrenia, chronic condition ([facility]) [F20.0] 12/25/2013  . Alcohol abuse [F10.10] 12/25/2013  . Schizophrenia ([facility]) [F20.9] 12/24/2013   Subjective Data:   Continued Clinical Symptoms:  Alcohol Use Disorder Identification Test Final Score (AUDIT): 0 The "Alcohol Use Disorders Identification Test", Guidelines for Use in Primary Care, Second Edition.  World Science writerHealth Organization [facility](WHO). Score between 0-7:  no or low risk or alcohol related problems. Score between 8-15:  moderate risk of alcohol related problems. Score between 16-19:  high risk of alcohol related problems. Score 20 or above:  warrants further diagnostic evaluation for alcohol dependence and treatment.   CLINICAL FACTORS:   Schizophrenia:   Depressive state Paranoid or undifferentiated type   Musculoskeletal: Strength & Muscle Tone: within normal limits Gait & Station: normal Patient leans: N/A  Psychiatric Specialty Exam: Physical Exam  ROS  Blood pressure 83/46, pulse 81, temperature 99 F (37.2 C), temperature source Oral, resp. rate 18, height 5' 7.5" (1.715 m), weight 43.999 kg (97 lb), SpO2 99 %.Body mass index is 14.96 kg/(m^2).    General Appearance: Bizarre and Disheveled  Eye Contact: Poor  Speech: Normal Rate  Volume: Decreased  Mood: Depressed and Dysphoric  Affect: Flat  Thought Process: Disorganized  Orientation: Other: self  Thought Content:  Hallucinations: Auditory Visual  Suicidal Thoughts: Yes. with intent/plan on admission though she denied today  Homicidal Thoughts: No  Memory: Immediate; Poor Recent; Poor Remote; Poor  Judgement: Impaired  Insight: Lacking and Shallow  Psychomotor Activity: Decreased  Concentration: Concentration: Poor  Recall: Poor  Fund of Knowledge: Fair  Language: Poor  Akathisia: No  Handed: Right  AIMS (if indicated):    Assets: Leisure Time Resilience Social Support  ADL's: Impaired  Cognition: Impaired, Moderate                                                             COGNITIVE FEATURES THAT CONTRIBUTE TO RISK:  Loss of executive function    SUICIDE RISK:   Moderate:  Frequent suicidal ideation with limited intensity, and duration, some specificity in terms of plans, no associated intent, good self-control, limited dysphoria/symptomatology, some risk factors present, and identifiable protective factors, including available and accessible social support.  PLAN OF CARE: admit on BH   I certify that inpatient services furnished can reasonably be expected to improve the patient's condition.   Lockie ParesBell,  Hattye Siegfried L, MD 04/24/2016, 8:08 PM

## 2016-04-24 NOTE — [facility] Group Notes (Signed)
[facility] LCSW Group Therapy  04/24/2016 2:00 PM  Type of Therapy:  Group Therapy  Participation Level:  Did Not Attend  Modes of Intervention:  Discussion, Education, Socialization and Support  Summary of Progress/Problems: Todays topic: Grudges  Patients will be encouraged to discuss their thoughts, feelings, and behaviors as to why one holds on to grudges and reasons why people have grudges. Patients will process the impact of grudges on their daily lives and identify thoughts and feelings related to holding grudges. Patients will identify feelings and thoughts related to what life would look like without grudges.   Jeneva Schweizer L Tyshawn Ciullo MSW, LCSWA  04/24/2016, 2:00 PM   

## 2016-04-24 NOTE — Progress Notes (Signed)
NUTRITION ASSESSMENT  Pt identified as at risk on the Malnutrition Screen Tool  INTERVENTION: 1. Monitor intake and cater to pt preferences 2. Recommend mightyshake TID for added nutrition  NUTRITION DIAGNOSIS: Unintentional weight loss related to sub-optimal intake as evidenced by pt report.   Goal: Pt to meet >/= 90% of their estimated nutrition needs.  Monitor:  PO intake  Assessment:    45 y.o. female admitted with severe depression, delusions, paranoid.  Past Medical History  Diagnosis Date  . Schizophrenia ([facility])     only known    Noted po intake on average 50% of meals consumed  Height: Ht Readings from Last 1 Encounters:  04/23/16 5' 7.5" (1.715 m)    Weight: Wt Readings from Last 1 Encounters:  04/23/16 97 lb (43.999 kg)    Weight Hx: 23% wt loss in the last 2 year + Wt Readings from Last 10 Encounters:  04/23/16 97 lb (43.999 kg)  12/24/13 127 lb (57.607 kg)    BMI:  Body mass index is 14.96 kg/(m^2).   Estimated Nutritional Needs: Kcal: 25-30 kcal/kg Protein: > 1 gram protein/kg Fluid: 1 ml/kcal  Diet Order: Diet regular Room service appropriate?: Yes; Fluid consistency:: Thin Pt is also offered choice of unit snacks. Pt is eating as desired.   Lab results and medications reviewed.   Michon Kaczmarek B. Freida BusmanAllen, RD, LDN [phone] (pager) Weekend/On-Call pager [phone])

## 2016-04-25 MED ORDER — HALOPERIDOL 5 MG PO TABS
15.0000 mg | ORAL_TABLET | Freq: Two times a day (BID) | ORAL | Status: DC
  Administered 2016-04-25 – 2016-04-26 (×2): 15 mg via ORAL
  Filled 2016-04-25 (×2): qty 3

## 2016-04-25 NOTE — [facility] Group Notes (Signed)
[facility] Group Notes:  (Nursing/MHT/Case Management/Adjunct)  Date:  04/25/2016  Time:  1:26 AM  Type of Therapy:  Group Therapy  Participation Level:  Minimal  Participation Quality:  Inattentive  Affect:  Blunted  Cognitive:  Disorganized  Insight:  Limited  Engagement in Group:  Limited  Modes of Intervention:  n/a  Summary of Progress/Problems:  Veva Holesshley Imani Rainey Kahrs 04/25/2016, 1:26 AM

## 2016-04-25 NOTE — Progress Notes (Signed)
[facility]  04/25/2016 4:39 PM [name] [name]  MRN:  [id number]  This is a follow up of [name] [name] on 04/25/2016    Subjective:  Patient seen wandering the halls mumbling to herself. On interview today, she continues to be paranoid and tangential. She states that she is worried because her trailer tried to kill her. "It was goingto cut me up and put me in the kitchen... Maybe even cook me!" When asked if she was hearing voices, she replied " no nothing like that... I'm not crazy! The trailer was telling me it was going to put me in oil and fry me!" She also was concerned that a peer on the unit was "almost eaten alive by her food." Patient still hyperfocused on [employer] and paranoid.  She states that she slept ok and did not go to groups.    Principal Problem: <principal problem not specified> Diagnosis:   Patient Active Problem List   Diagnosis Date Noted  . Psychosis [F29] 04/23/2016  . Paranoid schizophrenia, chronic condition (HCC) [F20.0] 12/25/2013  . Alcohol abuse [F10.10] 12/25/2013  . Schizophrenia (HCC) [F20.9] 12/24/2013   Total Time spent with patient: 30 minutes  Past Psychiatric History: schizophrenia  Past Medical History:  Past Medical History  Diagnosis Date  . Schizophrenia (HCC)     only known    History reviewed. No pertinent past surgical history. Family History: History reviewed. No pertinent family history. Family Psychiatric  History: denies  Social History:  History  Alcohol Use  . 0.0 oz/week  . 1-2 Cans of beer per week    Comment: unknown - 04/22/16     History  Drug Use Not on file    Social History   Social History  . Marital Status: Single    Spouse Name: N/A  . Number of Children: N/A  . Years of Education: N/A   Social History Main Topics  . Smoking status: Current Every Day Smoker -- 1.00 packs/day    Types: Cigarettes  . Smokeless tobacco: None  . Alcohol Use: 0.0 oz/week    1-2 Cans of beer per week     Comment: unknown -  04/22/16  . Drug Use: None  . Sexual Activity: Not Asked   Other Topics Concern  . None   Social History Narrative   Additional Social History:                         Sleep: Good  Appetite:  Fair  Current Medications: Current Facility-Administered Medications  Medication Dose Route Frequency Provider Last Rate Last Dose  . acetaminophen (TYLENOL) tablet 650 mg  650 mg Oral Q6H PRN Jimmy FootmanAndrea Hernandez-Gonzalez, MD      . alum & mag hydroxide-simeth (MAALOX/MYLANTA) 200-200-20 MG/5ML suspension 30 mL  30 mL Oral Q4H PRN Jimmy FootmanAndrea Hernandez-Gonzalez, MD      . feeding supplement (ENSURE ENLIVE) (ENSURE ENLIVE) liquid 237 mL  237 mL Oral BID BM Naydeline Morace L An Schnabel, MD   237 mL at 04/25/16 1400  . haloperidol (HALDOL) tablet 12.5 mg  12.5 mg Oral BID Lockie Paresiffani L Laquinda Moller, MD   12.5 mg at 04/25/16 0903  . magnesium hydroxide (MILK OF MAGNESIA) suspension 30 mL  30 mL Oral Daily PRN Jimmy FootmanAndrea Hernandez-Gonzalez, MD      . multivitamin liquid 15 mL  15 mL Oral Daily Lockie Paresiffani L Kimbree Casanas, MD   15 mL at 04/25/16 0903  . traZODone (DESYREL) tablet 100 mg  100 mg Oral  QHS PRN Jimmy Footman, MD      . traZODone (DESYREL) tablet 100 mg  100 mg Oral QHS Jimmy Footman, MD   100 mg at 04/24/16 2134  . trihexyphenidyl (ARTANE) tablet 7.5 mg  7.5 mg Oral BID WC Cindi Carbon, RPH   7.5 mg at 04/25/16 1608    Lab Results: No results found for this or any previous visit (from the past 48 hour(s)).  Blood Alcohol level:  Lab Results  Component Value Date   ETH <5 04/22/2016   ETH 15* 12/23/2013    Physical Findings: AIMS:  , ,  ,  ,    CIWA:    COWS:     Musculoskeletal: Strength & Muscle Tone: within normal limits Gait & Station: normal Patient leans: N/A  Psychiatric Specialty Exam: Physical Exam  ROS  Blood pressure 90/56, pulse 81, temperature 98.9 F (37.2 C), temperature source Oral, resp. rate 18, height 5' 7.5" (1.715 m), weight 43.999 kg (97 lb), SpO2 99 %.Body mass  index is 14.96 kg/(m^2).  General Appearance: Bizarre and Disheveled  Eye Contact:  Poor  Speech:  Pressured  Volume:  Normal  Mood:  Anxious and Dysphoric  Affect:  Inappropriate  Thought Process:  Disorganized  Orientation:  Other:  self  Thought Content:  Hallucinations: Auditory Visual  Suicidal Thoughts:  No  Homicidal Thoughts:  No  Memory:  Immediate;   Poor Recent;   Poor Remote;   Poor  Judgement:  Impaired  Insight:  Lacking  Psychomotor Activity:  Normal  Concentration:  Concentration: Poor  Recall:  Poor  Fund of Knowledge:  Poor  Language:  Fair  Akathisia:  No  Handed:  Right  AIMS (if indicated):     Assets:  Leisure Time Resilience Social Support  ADL's:  Impaired  Cognition:  Impaired,  Moderate  Sleep:  Number of Hours: 54.23   45 year old with schizophrenia and continued paranoia.  Treatment Plan Summary: Daily contact with patient to assess and evaluate symptoms and progress in treatment and Medication management    -Medication management: -Increase Haldol  To 15 mg BID for psychosis,  Tegretol 200 mg BID for mood stabilization,  Trazodone 100 mg at bedtime for sleep, and Artane 7.5 mg BID for for EPS.    Observation Level/Precautions: Elopement 15 minute checks  Laboratory: as ordered  Psychotherapy: Milieu and group  Medications: As mentioned above  Consultations:   Discharge Concerns: f/u  Estimated LOS: 5-7 days   Other:         Lockie Pares, MD 04/25/2016, 4:39 PM

## 2016-04-25 NOTE — [facility] Group Notes (Signed)
[facility] LCSW Group Therapy  04/25/2016 2:05 PM  Type of Therapy:  Group Therapy  Participation Level:  Did Not Attend  Modes of Intervention:  Discussion, Education, Socialization and Support  Summary of Progress/Problems: Pt will identify unhealthy thoughts and how they impact their emotions and behavior. Pt will be encouraged to discuss these thoughts, emotions and behaviors with the group.    Rodneisha Bonnet L Lashonna Rieke MSW, LCSWA  04/25/2016, 2:05 PM  

## 2016-04-25 NOTE — Progress Notes (Signed)
Patient is med compliant. Paces throughout unit responding. Pt continues to look at thumb and talk. No interaction between staff or peers. Disorganized. Pt reports being sleepy this am. Continues not to eat. Pt offered fluids and is receptive. Will continue to assess and monitor for safety.

## 2016-04-25 NOTE — Progress Notes (Signed)
D: Pt disorganized and tangential but pleasant and cooperative. Medication compliant. Appeared to be responding to internal stimuli.  A: Encouragement and support provided. Q15 minute checks maintained for safety. Medications given as prescribed.  R: Remains safe on unit. Voices no additional concerns. Will continue to monitor.

## 2016-04-25 NOTE — Plan of Care (Signed)
Problem: Activity: Goal: Interest or engagement in activities will improve Outcome: Progressing Pt did attempt to go to group

## 2016-04-25 NOTE — Progress Notes (Signed)
Patient ID: Mcarthur RossettiWendy Bartus, female   DOB: 01/26/1971, 45 y.o.   MRN: [id number]  CSW attempted to complete assessment. Pt was unable to participate due to symptoms.   Daisy FloroCandace L Sakeenah Valcarcel MSW, LCSWA  04/25/2016 3:07 PM

## 2016-04-26 MED ORDER — CARBAMAZEPINE 200 MG PO TABS
200.0000 mg | ORAL_TABLET | Freq: Two times a day (BID) | ORAL | Status: DC
  Administered 2016-04-26 – 2016-04-27 (×3): 200 mg via ORAL
  Filled 2016-04-26 (×3): qty 1

## 2016-04-26 MED ORDER — PALIPERIDONE ER 3 MG PO TB24
6.0000 mg | ORAL_TABLET | Freq: Every day | ORAL | Status: DC
  Administered 2016-04-26 – 2016-04-27 (×2): 6 mg via ORAL
  Filled 2016-04-26 (×2): qty 2

## 2016-04-26 NOTE — Progress Notes (Signed)
Recreation Therapy Notes  Date: 05.29.17 Time: 11:00 am Location: Craft Room  Group Topic: Self-expression  Goal Area(s) Addresses:  Patient will identify one color per emotion listed on wheel. Patient will verbalize benefit of using art as a means of self-expression. Patient will verbalize one emotion experienced during session. Patient will be educated on other forms of self-expression.  Behavioral Response: Did not attend  Intervention: Emotion Wheel  Activity: Patients were given an Emotion Wheel worksheet and instructed to pick a color for each emotion listed.   Education: LRT educated patients on other forms of self-expression.  Education Outcome: Patient did not attend group.  Clinical Observations/Feedback: Patient did not attend group.  Tajai Ihde M, LRT/CTRS 04/26/2016 11:52 AM 

## 2016-04-26 NOTE — Progress Notes (Signed)
D: Pt disorganized. Pacing unit and talking to thumb; tapping thumb with finger. Responds with yes or no. No initiation. Staring and wide eyed. Medication compliant. No interaction with peers or staff. Disheveled. Needs promptings for hygiene. Ate evening snack. Denies SI/AVH although she is seen having conversations with self and talking into her hand. Denies pains. Voices no concerns at this time.  A: Encouragement and support provided. Q15 minute checks maintained for safety. Medications given as prescribed.  R:Remains safe on unit. Will continue to monitor.

## 2016-04-26 NOTE — Plan of Care (Signed)
Problem: Nutritional: Goal: Ability to achieve adequate nutritional intake will improve Outcome: Progressing Took 100% of ensure.

## 2016-04-26 NOTE — [facility] Group Notes (Signed)
[facility] LCSW Group Therapy   04/26/2016 9:30am Type of Therapy: Group Therapy   Participation Level: Active   Participation Quality: Attentive, Sharing and Supportive   Affect: Depressed and Flat   Cognitive: Alert and Oriented   Insight: Developing/Improving and Engaged   Engagement in Therapy: Developing/Improving and Engaged   Modes of Intervention: Clarification, Confrontation, Discussion, Education, Exploration,  Limit-setting, Orientation, Problem-solving, Rapport Building, Dance movement psychotherapisteality Testing, Socialization and Support   Summary of Progress/Problems: Pt identified obstacles faced currently and processed barriers involved in overcoming these obstacles. Pt identified steps necessary for overcoming these obstacles and explored motivation (internal and external) for facing these difficulties head on. Pt further identified one area of concern in their lives and chose a goal to focus on for today. Pt did share, after a rambling monologue of disconnected topics that she wanted "to be free".  Pt continually talked throughout the entirety of the group in a very low monotone, but was not a disturbing factor in the group dynamic.  Pt did get up and leave and return several times.  Other than her intrusive "mumblings" pt was relatively quiet.    Dorothe PeaJonathan F. Parthiv Mucci, LCSWA, LCAS  04/26/16

## 2016-04-26 NOTE — Progress Notes (Addendum)
Patient is disorganized and unable to make any conversations.Stated that the trailer is trying to kill her.Disheveled,stated that she took a shower today.Denies AV hallucinations.But she was talking to herself & responding to stimuli.No interactions with peers.In & out of room.Attended one group partially.Compliant with medications.Drinks ensure.Appeite poor to fair.

## 2016-04-26 NOTE — Progress Notes (Signed)
[facility]  04/26/2016 3:22 PM [name] [name] Dines  MRN:  [id number]  This is a follow up of [name] [name] [place] on 04/26/2016    Subjective:   Patient remains psychotic and 30 mg of Haldol a day.   She is often seen walking through unit mumbling to herself.  During exam, patient is difficult to redirect and uninterruptible.  He states that she is here again because the trailer trying to kill her.  She is paranoid about food because she thinks the food in somehow trying to "cut me up into little pieces and fry me."  Patient appears afraid.  When asked if she is hearing voices she denies this but begins mumbling about voices that she hears.  Continues to be hyper focused on [employer] and paranoid.  She is willing to drink insure though she does not want to eat.  She states that she slept ok I guess and  Is unsure if she went to groups   per nursing: D: Pt disorganized. Pacing unit and talking to thumb; tapping thumb with finger. Responds with yes or no. No initiation. Staring and wide eyed. Medication compliant. No interaction with peers or staff. Disheveled. Needs promptings for hygiene. Ate evening snack. Denies SI/AVH although she is seen having conversations with self and talking into her hand. Denies pains. Voices no concerns at this time.  A: Encouragement and support provided. Q15 minute checks maintained for safety. Medications given as prescribed. R:Remains safe on unit. Will continue to monitor.   Principal Problem: Psychosis Diagnosis:   Patient Active Problem List   Diagnosis Date Noted  . Psychosis [F29] 04/23/2016  . Paranoid schizophrenia, chronic condition (HCC) [F20.0] 12/25/2013  . Alcohol abuse [F10.10] 12/25/2013  . Schizophrenia (HCC) [F20.9] 12/24/2013   Total Time spent with patient: 30 minutes  Past Psychiatric History: schizophrenia  Past Medical History:  Past Medical History  Diagnosis Date  . Schizophrenia (HCC)     only known    History reviewed. No pertinent past  surgical history. Family History: History reviewed. No pertinent family history. Family Psychiatric  History: denies  Social History:  History  Alcohol Use  . 0.0 oz/week  . 1-2 Cans of beer per week    Comment: unknown - 04/22/16     History  Drug Use Not on file    Social History   Social History  . Marital Status: Single    Spouse Name: N/A  . Number of Children: N/A  . Years of Education: N/A   Social History Main Topics  . Smoking status: Current Every Day Smoker -- 1.00 packs/day    Types: Cigarettes  . Smokeless tobacco: None  . Alcohol Use: 0.0 oz/week    1-2 Cans of beer per week     Comment: unknown - 04/22/16  . Drug Use: None  . Sexual Activity: Not Asked   Other Topics Concern  . None   Social History Narrative   Additional Social History:                         Sleep: Fair  Appetite:  Fair  Current Medications: Current Facility-Administered Medications  Medication Dose Route Frequency Provider Last Rate Last Dose  . acetaminophen (TYLENOL) tablet 650 mg  650 mg Oral Q6H PRN Jimmy FootmanAndrea Hernandez-Gonzalez, MD      . alum & mag hydroxide-simeth (MAALOX/MYLANTA) 200-200-20 MG/5ML suspension 30 mL  30 mL Oral Q4H PRN Jimmy FootmanAndrea Hernandez-Gonzalez, MD      .  carbamazepine (TEGRETOL) tablet 200 mg  200 mg Oral BID Lockie Pares, MD   200 mg at 04/26/16 1507  . feeding supplement (ENSURE ENLIVE) (ENSURE ENLIVE) liquid 237 mL  237 mL Oral BID BM Edgard Debord L Meria Crilly, MD   237 mL at 04/26/16 1400  . magnesium hydroxide (MILK OF MAGNESIA) suspension 30 mL  30 mL Oral Daily PRN Jimmy Footman, MD      . multivitamin liquid 15 mL  15 mL Oral Daily Tameshia Bonneville Rhae Lerner, MD   15 mL at 04/26/16 0904  . paliperidone (INVEGA) 24 hr tablet 6 mg  6 mg Oral Daily Graham Hyun L Markeria Goetsch, MD   6 mg at 04/26/16 1507  . traZODone (DESYREL) tablet 100 mg  100 mg Oral QHS PRN Jimmy Footman, MD      . traZODone (DESYREL) tablet 100 mg  100 mg Oral QHS Jimmy Footman, MD   100 mg at 04/25/16 2207  . trihexyphenidyl (ARTANE) tablet 7.5 mg  7.5 mg Oral BID WC Cindi Carbon, RPH   7.5 mg at 04/26/16 1610    Lab Results: No results found for this or any previous visit (from the past 48 hour(s)).  Blood Alcohol level:  Lab Results  Component Value Date   ETH <5 04/22/2016   ETH 15* 12/23/2013    Physical Findings: AIMS:  , ,  ,  ,    CIWA:    COWS:     Musculoskeletal: Strength & Muscle Tone: within normal limits Gait & Station: normal Patient leans: N/A  Psychiatric Specialty Exam: Physical Exam  ROS  Blood pressure 86/41, pulse 66, temperature 98.2 F (36.8 C), temperature source Oral, resp. rate 18, height 5' 7.5" (1.715 m), weight 43.999 kg (97 lb), SpO2 99 %.Body mass index is 14.96 kg/(m^2).  General Appearance: Bizarre and Disheveled  Eye Contact:  Poor  Speech:  Pressured  Volume:  Normal  Mood:  Anxious and Dysphoric  Affect:  Inappropriate  Thought Process:  Disorganized  Orientation:  Other:  self  Thought Content:  Hallucinations: Auditory Visual   Frequent obsessions about the [employer]  Suicidal Thoughts:  No  Homicidal Thoughts:  No  Memory:  Immediate;   Poor Recent;   Poor Remote;   Poor  Judgement:  Impaired  Insight:  Lacking  Psychomotor Activity:  Normal  Concentration:  Concentration: Poor  Recall:  Poor  Fund of Knowledge:  Poor  Language:  Fair  Akathisia:  No  Handed:  Right  AIMS (if indicated):     Assets:  Leisure Time Resilience Social Support  ADL's:  Impaired  Cognition:  Impaired,  Moderate  Sleep:  Number of Hours: 38   45 year old with schizophrenia and continued paranoia.  Treatment Plan Summary: Daily contact with patient to assess and evaluate symptoms and progress in treatment and Medication management   -   Elopement precautions and 15 minute checks  laboratory: As ordered  psychotherapy: Per milieu and group therapy  -Medication management:  discontinued Haldol  15 mg twice a day due to continued paranoia and psychosis with little improvement.  Started patient on  InVega  6 mg. Tegretol 200 mg BID for mood stabilization  As per consult note -  Patient may need higher doses of InVega due to Tegretol Trazodone 100 mg at bedtime for sleep, and Artane 7.5 mg BID for for EPS.  patient's sleep has been sufficient most nights.    discharge concerns:  Placement  estimated length of  stay: 5-7 days depending resolution of psychosis                        Lockie Pares, MD 04/26/2016, 3:22 PM

## 2016-04-27 ENCOUNTER — Encounter: Payer: Self-pay | Admitting: Psychiatry

## 2016-04-27 DIAGNOSIS — F17200 Nicotine dependence, unspecified, uncomplicated: Secondary | ICD-10-CM

## 2016-04-27 LAB — LIPID PANEL
CHOLESTEROL: 177 mg/dL (ref 0–200)
HDL: 58 mg/dL (ref >40)
LDL Cholesterol: 94 mg/dL (ref 0–99)
TRIGLYCERIDES: 124 mg/dL (ref <150)
Total CHOL/HDL Ratio: 3.1 RATIO
VLDL: 25 mg/dL (ref 0–40)

## 2016-04-27 LAB — TSH: TSH: 1.414 u[IU]/mL (ref 0.350–4.500)

## 2016-04-27 LAB — VITAMIN B12: Vitamin B-12: 261 pg/mL (ref 180–914)

## 2016-04-27 MED ORDER — DIPHENHYDRAMINE HCL 25 MG PO CAPS
50.0000 mg | ORAL_CAPSULE | Freq: Every day | ORAL | Status: DC
  Administered 2016-04-27 – 2016-04-29 (×3): 50 mg via ORAL
  Filled 2016-04-27 (×3): qty 2

## 2016-04-27 MED ORDER — LORAZEPAM 1 MG PO TABS
1.0000 mg | ORAL_TABLET | Freq: Four times a day (QID) | ORAL | Status: DC | PRN
  Administered 2016-04-28: 1 mg via ORAL
  Filled 2016-04-27: qty 1

## 2016-04-27 MED ORDER — ENSURE ENLIVE PO LIQD
237.0000 mL | Freq: Three times a day (TID) | ORAL | Status: DC
  Administered 2016-04-27 – 2016-05-07 (×28): 237 mL via ORAL

## 2016-04-27 MED ORDER — NICOTINE 21 MG/24HR TD PT24
21.0000 mg | MEDICATED_PATCH | Freq: Every day | TRANSDERMAL | Status: DC
Start: 2016-04-27 — End: 2016-05-07
  Filled 2016-04-27 (×6): qty 1

## 2016-04-27 MED ORDER — PALIPERIDONE ER 3 MG PO TB24
9.0000 mg | ORAL_TABLET | Freq: Every day | ORAL | Status: DC
  Administered 2016-04-28: 9 mg via ORAL
  Filled 2016-04-27: qty 3

## 2016-04-27 NOTE — Progress Notes (Addendum)
Patient with sad affect, blunted and slightly anxious behavior. Patient disorganized and delusional in speech, cooperative with meals, meds, nutritional shakes. Minimal interaction with peers. Noted to respond to internal stimuli. Safety maintained. Refuses nicotene patch, watching tv at this time.

## 2016-04-27 NOTE — [facility] Group Notes (Signed)
[facility] Group Notes:  (Nursing/MHT/Case Management/Adjunct)  Date:  04/27/2016  Time:  3:00 PM  Type of Therapy:  Psychoeducational Skills  Participation Level:  Did Not Attend   [name] Farberamela M Moses [name] 04/27/2016, 3:00 PM

## 2016-04-27 NOTE — [facility] Group Notes (Signed)
[facility] Group Notes:  (Nursing/MHT/Case Management/Adjunct)  Date:  04/27/2016  Time:  12:00 AM  Type of Therapy:  Psychoeducational Skills  Participation Level:  Active  Participation Quality:  Appropriate  Affect:  Appropriate  Cognitive:  Appropriate  Insight:  Appropriate  Engagement in Group:  Improving  Modes of Intervention:  Discussion and Socialization  Summary of Progress/Problems:  Chancy MilroyLaquanda Y Treshon Stannard 04/27/2016, 12:00 AM

## 2016-04-27 NOTE — Plan of Care (Signed)
Problem: Safety: Goal: Ability to remain free from injury will improve Outcome: Progressing Patient has remained free from harms since admission.

## 2016-04-27 NOTE — [facility] Group Notes (Signed)
[facility] Group Notes:  (Nursing/MHT/Case Management/Adjunct)  Date:  04/27/2016  Time:  9:20 PM  Type of Therapy:  Group Therapy  Participation Level:  Active  Participation Quality:  Appropriate  Affect:  Appropriate  Cognitive:  Appropriate  Insight:  Appropriate  Engagement in Group:  Engaged  Modes of Intervention:  Discussion  Summary of Progress/Problems:  [name] [name] 04/27/2016, 9:20 PM

## 2016-04-27 NOTE — Progress Notes (Signed)
[facility]  04/27/2016 3:20 PM [name] [name] Pandit  MRN:  [id number]  This is a follow up of [name] [name] Notz on 04/27/2016    Subjective:   During assessment the patient was disorganized. I was unable to follow the statements she was making. She seen walking down the hallways talking to herself. Per nursing staff and the patient is talking about the FBI, states that food can kill her. She has been seen talking to her hand. She has voiced to the nurses that things are going through the airwaves.   Per the nurses patient has been eating and sleeping well. She has been compliant with medications.  Per nursing:  Patient with sad affect, blunted and slightly anxious behavior. Patient disorganized and delusional in speech, cooperative with meals, meds, nutritional shakes. Minimal interaction with peers. Noted to respond to internal stimuli. Safety maintained.   Principal Problem: Paranoid schizophrenia, chronic condition (HCC) Diagnosis:   Patient Active Problem List   Diagnosis Date Noted  . Tobacco use disorder [F17.200] 04/27/2016  . Paranoid schizophrenia, chronic condition (HCC) [F20.0] 12/25/2013  . Alcohol abuse [F10.10] 12/25/2013   Total Time spent with patient: 30 minutes  Past Psychiatric History: schizophrenia  Past Medical History:  Past Medical History  Diagnosis Date  . Schizophrenia (HCC)     only known    History reviewed. No pertinent past surgical history. Family History: History reviewed. No pertinent family history. Family Psychiatric  History: denies  Social History:  History  Alcohol Use  . 0.0 oz/week  . 1-2 Cans of beer per week    Comment: unknown - 04/22/16     History  Drug Use Not on file    Social History   Social History  . Marital Status: Single    Spouse Name: N/A  . Number of Children: N/A  . Years of Education: N/A   Social History Main Topics  . Smoking status: Current Every Day Smoker -- 1.00 packs/day    Types: Cigarettes  .  Smokeless tobacco: None  . Alcohol Use: 0.0 oz/week    1-2 Cans of beer per week     Comment: unknown - 04/22/16  . Drug Use: None  . Sexual Activity: Not Asked   Other Topics Concern  . None   Social History Narrative     Current Medications: Current Facility-Administered Medications  Medication Dose Route Frequency Provider Last Rate Last Dose  . acetaminophen (TYLENOL) tablet 650 mg  650 mg Oral Q6H PRN Jimmy FootmanAndrea Hernandez-Gonzalez, MD      . alum & mag hydroxide-simeth (MAALOX/MYLANTA) 200-200-20 MG/5ML suspension 30 mL  30 mL Oral Q4H PRN Jimmy FootmanAndrea Hernandez-Gonzalez, MD      . diphenhydrAMINE (BENADRYL) capsule 50 mg  50 mg Oral QHS Jimmy FootmanAndrea Hernandez-Gonzalez, MD      . feeding supplement (ENSURE ENLIVE) (ENSURE ENLIVE) liquid 237 mL  237 mL Oral TID BM Jimmy FootmanAndrea Hernandez-Gonzalez, MD      . LORazepam (ATIVAN) tablet 1 mg  1 mg Oral Q6H PRN Jimmy FootmanAndrea Hernandez-Gonzalez, MD      . magnesium hydroxide (MILK OF MAGNESIA) suspension 30 mL  30 mL Oral Daily PRN Jimmy FootmanAndrea Hernandez-Gonzalez, MD      . multivitamin liquid 15 mL  15 mL Oral Daily Tiffani Rhae LernerL Bell, MD   15 mL at 04/27/16 0939  . [START ON 04/28/2016] paliperidone (INVEGA) 24 hr tablet 9 mg  9 mg Oral Daily Jimmy FootmanAndrea Hernandez-Gonzalez, MD        Lab Results: No results found for  this or any previous visit (from the past 48 hour(s)).  Blood Alcohol level:  Lab Results  Component Value Date   ETH <5 04/22/2016   ETH 15* 12/23/2013    Physical Findings: AIMS:  , ,  ,  ,    CIWA:    COWS:     Musculoskeletal: Strength & Muscle Tone: within normal limits Gait & Station: normal Patient leans: N/A  Psychiatric Specialty Exam: Physical Exam  Constitutional: She is oriented to person, place, and time. She appears well-developed.  HENT:  Head: Normocephalic and atraumatic.  Eyes: EOM are normal.  Neck: Normal range of motion.  Respiratory: Effort normal.  Musculoskeletal: Normal range of motion.  Neurological: She is alert and  oriented to person, place, and time.    Review of Systems  Constitutional: Negative.   HENT: Negative.   Eyes: Negative.   Respiratory: Negative.   Cardiovascular: Negative.   Gastrointestinal: Negative.   Genitourinary: Negative.   Musculoskeletal: Negative.   Skin: Negative.   Neurological: Negative.   Endo/Heme/Allergies: Negative.   Psychiatric/Behavioral: Positive for hallucinations.    Blood pressure 96/57, pulse 76, temperature 99.1 F (37.3 C), temperature source Oral, resp. rate 18, height 5' 7.5" (1.715 m), weight 43.999 kg (97 lb), SpO2 99 %.Body mass index is 14.96 kg/(m^2).  General Appearance: Bizarre and Disheveled  Eye Contact:  Poor  Speech:  Pressured  Volume:  Normal  Mood:  Anxious and Dysphoric  Affect:  Inappropriate  Thought Process:  Disorganized  Orientation:  Other:  self  Thought Content:  Hallucinations: Auditory Visual   Frequent obsessions about the FBI  Suicidal Thoughts:  No  Homicidal Thoughts:  No  Memory:  Immediate;   Poor Recent;   Poor Remote;   Poor  Judgement:  Impaired  Insight:  Lacking  Psychomotor Activity:  Normal  Concentration:  Concentration: Poor  Recall:  Poor  Fund of Knowledge:  Poor  Language:  Fair  Akathisia:  No  Handed:  Right  AIMS (if indicated):     Assets:  Leisure Time Resilience Social Support  ADL's:  Impaired  Cognition:  Impaired,  Moderate  Sleep:  Number of Hours: 46.74   45 year old with schizophrenia and continued paranoia.  Treatment Plan Summary: Daily contact with patient to assess and evaluate symptoms and progress in treatment and Medication management   Schizophrenia patient will be continued on Invega however I will increase the dose to 9 mg at bedtime. Plan to give her a long-acting injectable upon discharge.  For insomnia the patient will be giving Ativan 1 mg by mouth daily at bedtime  EPS to prevent EPS and we'll start the patient on Benadryl 50 mg daily at bedtime  Patient  appears small [place]. She has some delusions about the food thinking that will kill her. I have ordered ensure 3 times a day and multivitamins  Tobacco use disorder I will order nicotine patch 21 mg a day  Labs I will order hemoglobin A1c, lipid panel, prolactin level, TSH and vitamin B12   Jimmy Footman, MD 04/27/2016, 3:20 PM

## 2016-04-27 NOTE — Progress Notes (Signed)
Recreation Therapy Notes  Date: 05.30.17 Time: 9:30 am Location: Craft Room  Group Topic: Goal Setting  Goal Area(s) Addresses:  Patient will be able to identify one goal. Patient will verbalize benefit of setting goals.  Behavioral Response: Intermittently Attentive, Disruptive  Intervention: Step By Step  Activity: Patients were given a foot worksheet and instructed to write a goal inside the foot and write positive comments on the outside of the foot.  Education: LRT educated patients on ways they can celebrate in a healthy way after reaching their goal.  Education Outcome: In group clarification offered   Clinical Observations/Feedback: Patient talked about murder and the sheriff before group. LRT attempted to redirect patient. Patient completed activity by writing some goals. Patient was talking about cigarettes. LRT redirected patient. Patient did not contribute to group discussion. Patient appeared to be responding to internal stimuli because she was singing, talking, and laughing to herself and her hand during group discussion.  Jacquelynn CreeGreene,Kitrina Maurin M, LRT/CTRS 04/27/2016 10:20 AM

## 2016-04-27 NOTE — Progress Notes (Signed)
D: Patient very tangential and bizarre. Preoccupied talking about the [employer] bus and the sheriff's department. Denies SI/HI/AVH. Responding to internal stimuli seen talking to self in the [name] [name]. Denies pain. Went to group and participated.  A: Medication was given with education. Encouragement was provided.  R: Patient was compliant with medication. She has remained calm and cooperative. Safety maintained with 15 min checks.

## 2016-04-27 NOTE — Plan of Care (Signed)
Problem: Education: Goal: Mental status will improve Outcome: Not Progressing Patient responding to internal stimuli and remains delusional.

## 2016-04-27 NOTE — Tx Team (Signed)
Interdisciplinary Treatment Plan Update (Adult)  Date:  04/27/2016 Time Reviewed:  3:47 PM  Progress in Treatment: Attending groups: Yes. Participating in groups:  Yes. Taking medication as prescribed:  Yes. Tolerating medication:  Yes. Family/Significant othe contact made:  No, will contact:    Patient understands diagnosis:  Yes. Discussing patient identified problems/goals with staff:  Yes. Medical problems stabilized or resolved:  Yes. Denies suicidal/homicidal ideation: Yes. Issues/concerns per patient self-inventory:  No. Other:  New problem(s) identified: No, Describe:     Discharge Plan or Barriers:  Reason for Continuation of Hospitalization: Delusions  Hallucinations Medication stabilization Other; describe Regulating oral intake  Comments:45 year with schizophrenia. She was disorganized and difficult to follow during the interview. Patient was paranoid and had delusions about the government and phillip Morris.. While walking to the room the patient exclaimed that she was blind and needed some glasses from [employer]. Of note, she has no difficulty navigating the unit however. Pt has also verbalized some ideas that food can kill her and that she shouldn't use the phone because something is being transmitted in the airwaves.  Estimated length of stay: 7 days  New goal(s):  Review of initial/current patient goals per problem list:   1.  Goal(s):Pt will participate in aftercare plan.  Met:  No  Target date:prior to discharge  As evidenced MB:WGYK for housing and psychiatric follow up being identified   2.  Goal (s):Pt will demonstrate decrease in psychotic symptoms  Met:  No  Target date:Prior to discharge  As evidenced [id number] in signs and symptoms of psychosis OR Pt is deemed stable or at baseline by MD.  Attendees: Patient:  [name] [name] 5/30/20173:47 PM  Family:   5/30/20173:47 PM  Physician:  Hernandez 5/30/20173:47 PM  Nursing:   Elige Radon, RN  5/30/20173:47 PM  Case Manager:   5/30/20173:47 PM  Counselor:  Dossie Arbour, LCSW 5/30/20173:47 PM  Other:  Bonnye Fava, LCSWA 5/30/20173:47 PM  Other:  Marylou Flesher, LCSWA 5/30/20173:47 PM  Other:   5/30/20173:47 PM  Other:  5/30/20173:47 PM  Other:  5/30/20173:47 PM  Other:  5/30/20173:47 PM  Other:  5/30/20173:47 PM  Other:  5/30/20173:47 PM  Other:  5/30/20173:47 PM  Other:   5/30/20173:47 PM   Scribe for Treatment Team:   August Saucer, 04/27/2016, 3:47 PM, MSW, LCSW

## 2016-04-27 NOTE — Plan of Care (Signed)
Problem: Education: Goal: Will be free of psychotic symptoms Outcome: Not Progressing Pt appears to be responding to internal stimuli.  Problem: Health Behavior/Discharge Planning: Goal: Compliance with prescribed medication regimen will improve Outcome: Progressing Pt taking medications as prescribed.

## 2016-04-28 LAB — PROLACTIN: PROLACTIN: 79.3 ng/mL — AB (ref 4.8–23.3)

## 2016-04-28 LAB — HEMOGLOBIN A1C: Hgb A1c MFr Bld: 5.2 % (ref 4.0–6.0)

## 2016-04-28 MED ORDER — PALIPERIDONE ER 3 MG PO TB24
12.0000 mg | ORAL_TABLET | Freq: Every day | ORAL | Status: DC
  Administered 2016-04-29 – 2016-05-07 (×9): 12 mg via ORAL
  Filled 2016-04-28 (×9): qty 4

## 2016-04-28 NOTE — Progress Notes (Signed)
Recreation Therapy Notes  Date: 05.31.17 Time: 9:30 am Location: Craft Room  Group Topic: Self-esteem  Goal Area(s) Addresses:  Patient will identify positive traits about self. Patient will identify healthy coping skills.  Behavioral Response: Left early  Intervention: All About Me  Activity: Patients were instructed to make a pamphlet including positive traits, healthy coping skills, and their support system.  Education: LRT educated patients on ways to increase their self-esteem.  Education Outcome: Patient left before LRT educated group.  Clinical Observations/Feedback: Patient left group at approximately 9:57 am. Patient did not return to group.  Jacquelynn CreeGreene,[name] [name] M, LRT/CTRS 04/28/2016 10:47 AM

## 2016-04-28 NOTE — [facility] Group Notes (Signed)
[facility] Group Notes:  (Nursing/MHT/Case Management/Adjunct)  Date:  04/28/2016  Time:  4:24 PM  Type of Therapy:  Psychoeducational Skills  Participation Level:  Did Not Attend   Lynelle SmokeCara Travis [facility] Worth[facility]oni 04/28/2016, 4:24 PM

## 2016-04-28 NOTE — [facility] Group Notes (Signed)
[facility] LCSW Group Therapy   04/28/2016  9:30am   Type of Therapy: Group Therapy   Participation Level: Active   Participation Quality: Attentive, Sharing and Supportive   Affect: Depressed and Flat   Cognitive: Alert and Oriented   Insight: Developing/Improving and Engaged   Engagement in Therapy: Developing/Improving and Engaged   Modes of Intervention: Clarification, Confrontation, Discussion, Education, Exploration, Limit-setting, Orientation, Problem-solving, Rapport Building, Dance movement psychotherapisteality Testing, Socialization and Support   Summary of Progress/Problems: The topic for group today was emotional regulation. This group focused on both positive and negative emotion identification and allowed  group members to process ways to identify feelings, regulate negative emotions, and find healthy ways to manage internal/external emotions. Group members were asked to reflect on a time when their reaction to an emotion led to a negative outcome and explored how alternative responses using emotion regulation would have benefited them. Group members were also asked to discuss a time when emotion regulation was utilized when a negative emotion was experienced. Pt shared initially, but her thinking and sppech were unorganized and her thoughts were not connected.  The pt did present as pleasant and calm and was an addition to the group dynamic unti lshe began to leave and return.  This happened several times.  Pt was otherwise polite and cooperative with the CSW and other group members, but she was not focused and attentive to the topics discussed and the sharing of others.  Pt did not share unless prompted by the CSW, but did share when asked.    Dorothe PeaJonathan F. Jeremiyah Cullens, LCSWA, LCAS  04/28/16

## 2016-04-28 NOTE — Progress Notes (Signed)
D: Pt is disorganized and tangential this evening. It is difficult to follow the statements she makes during assessment. Pt denies SI/HI/AVH, although she appears to be responding to internal stimuli. Pt is medication compliant and drinks all of her ensure when offered. A: Emotional support and encouragement provided. Medications administered with education. q15 minute safety checks maintained. R: Pt remains free from harm. Will continue to monitor.

## 2016-04-28 NOTE — Plan of Care (Signed)
Problem: Activity: Goal: Interest or engagement in activities will improve Outcome: Not Progressing No attending any groups

## 2016-04-28 NOTE — Progress Notes (Signed)
Patient continues to wander around unit, responding. Med compliant. Does not attend group. No interaction with staff or peers. Encouragement and support offered. Pt continues to pace the halls. Remains safe on unit with q 15 min checks.

## 2016-04-28 NOTE — Progress Notes (Signed)
[name] MD Progress Note  04/28/2016 5:21 PM [name] [name]  MRN:  [id number]  This is a follow up of [name] [name] on 04/28/2016    Subjective:   During assessment the patient was disorganized. I was unable to follow the statements she was making. She seen walking down the hallways talking to herself. Per nursing staff and the patient is talking about the FBI, states that food can kill her. She has been seen talking to her hand. She has voiced to the nurses that things are going through the airwaves. No change since yesterday.  Per the nurses patient has not been eating but has been drinking ensures tid.  Pt is sleeping well. She has been compliant with medications.  Per nursing:  D: Pt is disorganized and tangential this evening. It is difficult to follow the statements she makes during assessment. Pt denies SI/HI/AVH, although she appears to be responding to internal stimuli. Pt is medication compliant and drinks all of her ensure when offered. A: Emotional support and encouragement provided. Medications administered with education. q15 minute safety checks maintained. R: Pt remains free from harm. Will continue to monitor.  Principal Problem: Paranoid schizophrenia, chronic condition (HCC) Diagnosis:   Patient Active Problem List   Diagnosis Date Noted  . Tobacco use disorder [F17.200] 04/27/2016  . Paranoid schizophrenia, chronic condition (HCC) [F20.0] 12/25/2013  . Alcohol abuse [F10.10] 12/25/2013   Total Time spent with patient: 30 minutes  Past Psychiatric History: schizophrenia  Past Medical History:  Past Medical History  Diagnosis Date  . Schizophrenia (HCC)     only known    History reviewed. No pertinent past surgical history. Family History: History reviewed. No pertinent family history. Family Psychiatric  History: denies  Social History:  History  Alcohol Use  . 0.0 oz/week  . 1-2 Cans of beer per week    Comment: unknown - 04/22/16     History  Drug Use Not on  file    Social History   Social History  . Marital Status: Single    Spouse Name: N/A  . Number of Children: N/A  . Years of Education: N/A   Social History Main Topics  . Smoking status: Current Every Day Smoker -- 1.00 packs/day    Types: Cigarettes  . Smokeless tobacco: None  . Alcohol Use: 0.0 oz/week    1-2 Cans of beer per week     Comment: unknown - 04/22/16  . Drug Use: None  . Sexual Activity: Not Asked   Other Topics Concern  . None   Social History Narrative     Current Medications: Current Facility-Administered Medications  Medication Dose Route Frequency Provider Last Rate Last Dose  . acetaminophen (TYLENOL) tablet 650 mg  650 mg Oral Q6H PRN Jimmy Footman, MD      . alum & mag hydroxide-simeth (MAALOX/MYLANTA) 200-200-20 MG/5ML suspension 30 mL  30 mL Oral Q4H PRN Jimmy Footman, MD      . diphenhydrAMINE (BENADRYL) capsule 50 mg  50 mg Oral QHS Jimmy Footman, MD   50 mg at 04/27/16 2147  . feeding supplement (ENSURE ENLIVE) (ENSURE ENLIVE) liquid 237 mL  237 mL Oral TID BM Jimmy Footman, MD   237 mL at 04/28/16 1400  . LORazepam (ATIVAN) tablet 1 mg  1 mg Oral Q6H PRN Jimmy Footman, MD      . magnesium hydroxide (MILK OF MAGNESIA) suspension 30 mL  30 mL Oral Daily PRN Jimmy Footman, MD      . multivitamin  liquid 15 mL  15 mL Oral Daily Lockie Paresiffani L Bell, MD   15 mL at 04/28/16 0905  . nicotine (NICODERM CQ - dosed in mg/24 hours) patch 21 mg  21 mg Transdermal Daily Jimmy FootmanAndrea Hernandez-Gonzalez, MD   21 mg at 04/27/16 1621  . [START ON 04/29/2016] paliperidone (INVEGA) 24 hr tablet 12 mg  12 mg Oral Daily Jimmy FootmanAndrea Hernandez-Gonzalez, MD        Lab Results:  Results for orders placed or performed during the hospital encounter of 04/23/16 (from the past 48 hour(s))  TSH     Status: None   Collection Time: 04/27/16  2:47 PM  Result Value Ref Range   TSH 1.414 0.350 - 4.500 uIU/mL  Lipid panel      Status: None   Collection Time: 04/27/16  2:47 PM  Result Value Ref Range   Cholesterol 177 0 - 200 mg/dL   Triglycerides 045124 <409<150 mg/dL   HDL 58 >81>40 mg/dL   Total CHOL/HDL Ratio 3.1 RATIO   VLDL 25 0 - 40 mg/dL   LDL Cholesterol 94 0 - 99 mg/dL    Comment:        Total Cholesterol/HDL:CHD Risk Coronary Heart Disease Risk Table                     Men   Women  1/2 Average Risk   3.4   3.3  Average Risk       5.0   4.4  2 X Average Risk   9.6   7.1  3 X Average Risk  23.4   11.0        Use the calculated Patient Ratio above and the CHD Risk Table to determine the patient's CHD Risk.        ATP III CLASSIFICATION (LDL):  <100     mg/dL   Optimal  191-478100-129  mg/dL   Near or Above                    Optimal  130-159  mg/dL   Borderline  [id number]  mg/dL   High  >308>190     mg/dL   Very High   Vitamin [id number]     Status: None   Collection Time: 04/27/16  2:47 PM  Result Value Ref Range   Vitamin B-12 261 180 - 914 pg/mL    Comment: (NOTE) This assay is not validated for testing neonatal or myeloproliferative syndrome specimens for Vitamin B12 levels. Performed at [facility]   Hemoglobin A1c     Status: None   Collection Time: 04/27/16  2:47 PM  Result Value Ref Range   Hgb A1c MFr Bld 5.2 4.0 - 6.0 %  Prolactin     Status: Abnormal   Collection Time: 04/27/16  2:47 PM  Result Value Ref Range   Prolactin 79.3 (H) 4.8 - 23.3 ng/mL    Comment: (NOTE) Performed At: [facility] [address] [place], [place] [id number] Mila HomerHancock William F MD [id number]:[phone]     Blood Alcohol level:  Lab Results  Component Value Date   [facility]   ETH 15* 12/23/2013    Physical Findings: AIMS:  , ,  ,  ,    CIWA:    COWS:     Musculoskeletal: Strength & Muscle Tone: within normal limits Gait & Station: normal Patient leans: N/A  Psychiatric Specialty Exam: Physical Exam  Constitutional: She is oriented to person, place, and time. She  appears well-developed.   HENT:  Head: Normocephalic and atraumatic.  Eyes: EOM are normal.  Neck: Normal range of motion.  Respiratory: Effort normal.  Musculoskeletal: Normal range of motion.  Neurological: She is alert and oriented to person, place, and time.    Review of Systems  Constitutional: Negative.   HENT: Negative.   Eyes: Negative.   Respiratory: Negative.   Cardiovascular: Negative.   Gastrointestinal: Negative.   Genitourinary: Negative.   Musculoskeletal: Negative.   Skin: Negative.   Neurological: Negative.   Endo/Heme/Allergies: Negative.   Psychiatric/Behavioral: Positive for hallucinations.    Blood pressure 85/59, pulse 79, temperature 98.8 F (37.1 C), temperature source Oral, resp. rate 20, height 5' 7.5" (1.715 m), weight 43.999 kg (97 lb), SpO2 99 %.Body mass index is 14.96 kg/(m^2).  General Appearance: Bizarre and Disheveled  Eye Contact:  Poor  Speech:  Pressured  Volume:  Normal  Mood:  Anxious and Dysphoric  Affect:  Inappropriate  Thought Process:  Disorganized  Orientation:  Other:  self  Thought Content:  Hallucinations: Auditory Visual   Frequent obsessions about the FBI  Suicidal Thoughts:  No  Homicidal Thoughts:  No  Memory:  Immediate;   Poor Recent;   Poor Remote;   Poor  Judgement:  Impaired  Insight:  Lacking  Psychomotor Activity:  Normal  Concentration:  Concentration: Poor  Recall:  Poor  Fund of Knowledge:  Poor  Language:  Fair  Akathisia:  No  Handed:  Right  AIMS (if indicated):     Assets:  Leisure Time Resilience Social Support  ADL's:  Impaired  Cognition:  Impaired,  Moderate  Sleep:  Number of Hours: 100.82   45 year old with schizophrenia and continued paranoia.  Treatment Plan Summary: Daily contact with patient to assess and evaluate symptoms and progress in treatment and Medication management   Schizophrenia patient will be continued on Invega I will increase the dose to 12 mg po qhs. Still delusional about the FBI and someone  trying to kill her.  For insomnia the patient will be giving Ativan 1 mg by mouth daily at bedtime  EPS to prevent EPS and we'll start the patient on Benadryl 50 mg daily at bedtime  Patient appears malnourish: She has some delusions about the food thinking that will kill her. I have ordered ensure 3 times a day and multivitamins--per staff no eating meals but has been drinking ensures.  Tobacco use disorder I will order nicotine patch 21 mg a day  Labs: HA1c 5.2, TSH wnl, prolactin elevated at 79.3.  B12 is 261  Referral made to [facility]  Hospitalization status will make IVC   Jimmy Footman, MD 04/28/2016, 5:21 PM

## 2016-04-28 NOTE — Progress Notes (Signed)
Patient ID: [name], female   DOB: 06/22/1971, 45 y.o.   MRN: [id number]  CSW attempted to complete assessment. Pt was unable to participate. CSW will attempt again later.   [name] FloroCandace L [name] MSW, LCSWA  04/28/2016 12:40 PM

## 2016-04-29 MED ORDER — PALIPERIDONE PALMITATE 234 MG/1.5ML IM SUSP
234.0000 mg | Freq: Once | INTRAMUSCULAR | Status: AC
  Administered 2016-04-29: 234 mg via INTRAMUSCULAR
  Filled 2016-04-29: qty 1.5

## 2016-04-29 NOTE — Progress Notes (Signed)
Patient's thoughts are tangential and disorganized.Pacing in & out of room.Talking to herself.Stated that she need the [employer] number to book a ticket.Since staff does not know the number she is ok with that.Attended some groups.Compliant with medications.Takes ensure.

## 2016-04-29 NOTE — Progress Notes (Signed)
[facility] MD Progress Note  04/29/2016 3:33 PM [name] [name]  MRN:  [id number]  This is a follow up of [name] [name] on 04/29/2016    Subjective:   Talking about the stock exchanged owning her money.  Still paranoid about the FBI and CIA. Saying she owns part of a large tobacco company.    Pt says medications are helping her.  Denies any SE.  Denies hallucinations, SI or HI.   Per nursing:  D: Observed pt pacing in the hallway. Patient alert and oriented x4. Patient denies SI/HI/AVH. Pt affect is blunted and anxious. Pt preoccupied with cigarettes. Pt coherency is tangential, jumping from talking about her mother, to being homeless and cigarettes. Pt content is disorganized. Pt interacts minimally with peers and staff and forwards little. Pt complained of difficulty sleeping A: Offered active listening and support. Provided therapeutic communication. Administered scheduled medications. Gave ativan prn for sleep. R: Pt pleasant and cooperative. Pt medication compliant. Will continue Q15 min. checks. Safety maintained.  Principal Problem: Paranoid schizophrenia, chronic condition (HCC) Diagnosis:   Patient Active Problem List   Diagnosis Date Noted  . Tobacco use disorder [F17.200] 04/27/2016  . Paranoid schizophrenia, chronic condition (HCC) [F20.0] 12/25/2013  . Alcohol abuse [F10.10] 12/25/2013   Total Time spent with patient: 30 minutes  Past Psychiatric History: schizophrenia  Past Medical History:  Past Medical History  Diagnosis Date  . Schizophrenia (HCC)     only known    History reviewed. No pertinent past surgical history. Family History: History reviewed. No pertinent family history. Family Psychiatric  History: denies  Social History:  History  Alcohol Use  . 0.0 oz/week  . 1-2 Cans of beer per week    Comment: unknown - 04/22/16     History  Drug Use Not on file    Social History   Social History  . Marital Status: Single    Spouse Name: N/A  . Number of Children:  N/A  . Years of Education: N/A   Social History Main Topics  . Smoking status: Current Every Day Smoker -- 1.00 packs/day    Types: Cigarettes  . Smokeless tobacco: None  . Alcohol Use: 0.0 oz/week    1-2 Cans of beer per week     Comment: unknown - 04/22/16  . Drug Use: None  . Sexual Activity: Not Asked   Other Topics Concern  . None   Social History Narrative     Current Medications: Current Facility-Administered Medications  Medication Dose Route Frequency Provider Last Rate Last Dose  . acetaminophen (TYLENOL) tablet 650 mg  650 mg Oral Q6H PRN Jimmy Footman, MD      . alum & mag hydroxide-simeth (MAALOX/MYLANTA) 200-200-20 MG/5ML suspension 30 mL  30 mL Oral Q4H PRN Jimmy Footman, MD      . diphenhydrAMINE (BENADRYL) capsule 50 mg  50 mg Oral QHS Jimmy Footman, MD   50 mg at 04/28/16 2128  . feeding supplement (ENSURE ENLIVE) (ENSURE ENLIVE) liquid 237 mL  237 mL Oral TID BM Jimmy Footman, MD   237 mL at 04/29/16 1400  . LORazepam (ATIVAN) tablet 1 mg  1 mg Oral Q6H PRN Jimmy Footman, MD   1 mg at 04/28/16 2213  . magnesium hydroxide (MILK OF MAGNESIA) suspension 30 mL  30 mL Oral Daily PRN Jimmy Footman, MD      . multivitamin liquid 15 mL  15 mL Oral Daily Tiffani Rhae Lerner, MD   15 mL at 04/29/16 0853  . nicotine (  NICODERM CQ - dosed in mg/24 hours) patch 21 mg  21 mg Transdermal Daily Jimmy FootmanAndrea Hernandez-Gonzalez, MD   21 mg at 04/27/16 1621  . paliperidone (INVEGA) 24 hr tablet 12 mg  12 mg Oral Daily Jimmy FootmanAndrea Hernandez-Gonzalez, MD   12 mg at 04/29/16 [id number]    Lab Results:  No results found for this or any previous visit (from the past 48 hour(s)).  Blood Alcohol level:  Lab Results  Component Value Date   ETH <5 04/22/2016   ETH 15* 12/23/2013    Physical Findings: AIMS:  , ,  ,  ,    CIWA:    COWS:     Musculoskeletal: Strength & Muscle Tone: within normal limits Gait & Station:  normal Patient leans: N/A  Psychiatric Specialty Exam: Physical Exam  Constitutional: She is oriented to person, place, and time. She appears well-developed and well-nourished.  HENT:  Head: Normocephalic and atraumatic.  Eyes: EOM are normal.  Neck: Normal range of motion.  Respiratory: Effort normal.  Musculoskeletal: Normal range of motion.  Neurological: She is alert and oriented to person, place, and time.    Review of Systems  Constitutional: Negative.   HENT: Negative.   Eyes: Negative.   Respiratory: Negative.   Cardiovascular: Negative.   Gastrointestinal: Negative.   Genitourinary: Negative.   Musculoskeletal: Negative.   Skin: Negative.   Neurological: Negative.   Endo/Heme/Allergies: Negative.   Psychiatric/Behavioral: Positive for hallucinations.    Blood pressure 102/75, pulse 90, temperature 98.2 F (36.8 C), temperature source Oral, resp. rate 20, height 5' 7.5" (1.715 m), weight 43.999 kg (97 lb), SpO2 99 %.Body mass index is 14.96 kg/(m^2).  General Appearance: Bizarre and Disheveled  Eye Contact:  Poor  Speech:  Pressured  Volume:  Normal  Mood:  Anxious and Dysphoric  Affect:  Inappropriate  Thought Process:  Disorganized  Orientation:  Other:  self  Thought Content:  Hallucinations: Auditory Visual   Frequent obsessions about the FBI  Suicidal Thoughts:  No  Homicidal Thoughts:  No  Memory:  Immediate;   Poor Recent;   Poor Remote;   Poor  Judgement:  Impaired  Insight:  Lacking  Psychomotor Activity:  Normal  Concentration:  Concentration: Poor  Recall:  Poor  Fund of Knowledge:  Poor  Language:  Fair  Akathisia:  No  Handed:  Right  AIMS (if indicated):     Assets:  Leisure Time Resilience Social Support  ADL's:  Impaired  Cognition:  Impaired,  Moderate  Sleep:  Number of Hours: 686   45 year old with schizophrenia and continued paranoia.  Treatment Plan Summary: Daily contact with patient to assess and evaluate symptoms and  progress in treatment and Medication management   Schizophrenia patient will be continued on Invega 12 mg po qhs. Still delusional about the FBI and someone trying to kill her. Received invega sustenna 234 mg   For insomnia the patient will be giving Ativan 1 mg by mouth daily at bedtime  EPS to prevent EPS continue Benadryl 50 mg daily at bedtime  Patient appears malnourish: She has some delusions about the food thinking that will kill her. I have ordered ensure 3 times a day and multivitamins--per staff no eating meals but has been drinking ensures.  Tobacco use disorder I will order nicotine patch 21 mg a day  Labs: HA1c 5.2, TSH wnl, prolactin elevated at 79.3.  B12 is 261  [facility]  Hospitalization status will make IVC today  Jimmy Footman, MD 04/29/2016, 3:33 PM

## 2016-04-29 NOTE — Plan of Care (Signed)
Problem: Safety: Goal: Ability to redirect hostility and anger into socially appropriate behaviors will improve Outcome: Progressing Pt has not shown any aggression or hostility this shift.

## 2016-04-29 NOTE — Progress Notes (Signed)
Recreation Therapy Notes  Date: 06.01.17 Time: 9:30 am Location: Craft Room  Group Topic: Leisure Education  Goal Area(s) Addresses:  Patient will identify activities for each letter of the alphabet. Patient will verbalize emotion experienced from leisure activities.  Behavioral Response: Left early  Intervention: Leisure Alphabet  Activity: Patients were given a Leisure Alphabet and instructed to write a healthy leisure activity for each letter of the alphabet.  Education: LRT educated patients on what they need to participate in leisure.  Education Outcome: Patient left before LRT educated group.   Clinical Observations/Feedback: Patient was talking about how she wanted to smoke and wanted to know when she could. LRT informed patient she could not smoke while she was in the hospital. Patient got up and left group. Patient did not return to group.  Jacquelynn CreeGreene,Samaira Holzworth M, LRT/CTRS 04/29/2016 10:17 AM

## 2016-04-29 NOTE — Plan of Care (Signed)
Problem: Self-Care: Goal: Ability to participate in self-care as condition permits will improve Outcome: Not Progressing Encouraged patient for personal hygiene.

## 2016-04-29 NOTE — [facility] Group Notes (Signed)
[facility] Group Notes:  (Nursing/MHT/Case Management/Adjunct)  Date:  04/29/2016  Time:  1:44 AM  Type of Therapy:  Group Therapy  Participation Level:  None    Summary of Progress/Problems:Pt attended group for the last five minutes. Did not share goal during that time.   Fanny Skatesshley Imani Tadhg Eskew 04/29/2016, 1:44 AM

## 2016-04-29 NOTE — Progress Notes (Signed)
D: Observed pt pacing in the hallway. Patient alert and oriented x4. Patient denies SI/HI/AVH. Pt affect is blunted and anxious. Pt preoccupied with cigarettes. Pt coherency is tangential, jumping from talking about her mother, to being homeless and cigarettes. Pt content is disorganized. Pt interacts minimally with peers and staff and forwards little. Pt complained of difficulty sleeping A: Offered active listening and support. Provided therapeutic communication. Administered scheduled medications. Gave ativan prn for sleep. R: Pt pleasant and cooperative. Pt medication compliant. Will continue Q15 min. checks. Safety maintained.

## 2016-04-30 MED ORDER — PALIPERIDONE PALMITATE 156 MG/ML IM SUSP
156.0000 mg | Freq: Once | INTRAMUSCULAR | Status: AC
  Administered 2016-05-03: 156 mg via INTRAMUSCULAR
  Filled 2016-04-30: qty 1

## 2016-04-30 MED ORDER — DIPHENHYDRAMINE HCL 25 MG PO CAPS
25.0000 mg | ORAL_CAPSULE | Freq: Every day | ORAL | Status: DC
  Administered 2016-04-30 – 2016-05-03 (×4): 25 mg via ORAL
  Filled 2016-04-30 (×4): qty 1

## 2016-04-30 MED ORDER — PALIPERIDONE PALMITATE 156 MG/ML IM SUSP
156.0000 mg | Freq: Once | INTRAMUSCULAR | Status: DC
Start: 2016-05-10 — End: 2016-04-30

## 2016-04-30 NOTE — Plan of Care (Signed)
Problem: Coping: Goal: Ability to cope will improve Outcome: Not Progressing Patient not improving: In room, guarded, bizarre with poor insight and poor judgement

## 2016-04-30 NOTE — Plan of Care (Signed)
Problem: Education: Goal: Verbalization of understanding the information provided will improve Outcome: Not Progressing Pt not receptive to education and appears apathetic to treatment process.

## 2016-04-30 NOTE — [facility] Group Notes (Signed)
[facility] LCSW Group Therapy   04/30/2016 1:15 PM   Type of Therapy: Group Therapy   Participation Level: Minimal   Participation Quality: Minimal   Affect: Depressed and Flat   Cognitive: Disconnected from current situation, not oriented   Insight: Pt was not developing/improving and engaged   Engagement in Therapy: Pt was not developing/improving and engaged   Modes of Intervention: Clarification, Confrontation, Discussion, Education, Exploration, Limit-setting, Orientation, Problem-solving, Rapport Building, Dance movement psychotherapisteality Testing, Socialization and Support   Summary of Progress/Problems: The topic for today was feelings about relapse. Pt was to discuss what relapse prevention is to them and identified triggers that they are on the path to relapse. Pt was process her feeling towards relapse and was able to relate to peers, but the pt did not participate in the group dynamic and alternately left the room and returned on more than five occassions. When asked by the CSW the pt did not discuss coping skills that can be used for relapse prevention, but just said she knew what relapse meant.  Pt's behavior was disorganized and she did not speak at length.  Pt was polite and cooperative with the CSW and other group members, but was not focused and attentive to the topics discussed and the sharing of others.     Dorothe PeaJonathan F. Nahjae Hoeg, MSW, LCSWA, LCAS

## 2016-04-30 NOTE — Progress Notes (Signed)
Recreation Therapy Notes  Date: 06.02.17 Time: 9:30 am Location: Craft Room  Group Topic: Coping Skills  Goal Area(s) Addresses:  Patient will participate in healthy coping skill. Patient will verbalize benefit of using art as a coping skill.  Behavioral Response: Did not attend  Intervention: Coloring  Activity: Patients were instructed to color and to think about the emotions they were feeling as well as what they were focused on while they were coloring.  Education: LRT educated patients on healthy coping skills.  Education Outcome: Patient did not attend group.  Clinical Observations/Feedback: Patient did not attend group.  Jacquelynn CreeGreene,Bashar Milam M, LRT/CTRS 04/30/2016 10:21 AM

## 2016-04-30 NOTE — Progress Notes (Signed)
Patient ID: Mcarthur RossettiWendy Morrill, female   DOB: 02/03/1971, 45 y.o.   MRN: [id number]  CSW attempted PSA. Pt could not participate. CSW will try again at a later time.   Daisy FloroCandace L Haniah Penny MSW, LCSWA  04/30/2016 10:15 AM

## 2016-04-30 NOTE — Plan of Care (Signed)
Problem: Safety: Goal: Ability to redirect hostility and anger into socially appropriate behaviors will improve Outcome: Progressing Patient remains irritable, guarded and unable to communicate her feelings and concerns

## 2016-04-30 NOTE — Progress Notes (Signed)
[facility]  04/30/2016 12:09 PM [name] [name]  MRN:  [id number]  This is a follow up of [name] RossettiWendy [name] on 04/30/2016    Subjective:   Today patient trying to get a hold of the Social Security office in [place].  Says that she collects Social Security but at the same time tells me that she has never received a check.  Her conversation is is still largely disorganized. Some of the statements are nonsensical. With redirection she can answer open ended questions. She is in agreement with taking Invega. Denies any side effects. States she has been eating her meals. States that she has been is sleeping well. Denies any side effects from medications.  Patient is still delusional about the police, the FBI, the CIA, tobacco companies and the [employer] market.  Pt says medications are helping her.  Denies any SE.  Denies hallucinations, SI or HI.   Per nursing:  D: Observed pt in hallway on telephone talking loudly. Patient alert and oriented. Patient denies SI/HI/AVH. Pt affect blunted and anxious. Pt tangential and disorganized in conversation talking about [employer], cigarettes and babies. Pt had no complaints. When asked about group pt indicated she went but learned "nothing much." A: Offered active listening and support. Provided therapeutic communication. Administered scheduled medications.  R: Pt cooperative and redirectable. Pt medication compliant. Will continue Q15 min. checks. Safety maintained.  Principal Problem: Paranoid schizophrenia, chronic condition (HCC) Diagnosis:   Patient Active Problem List   Diagnosis Date Noted  . Tobacco use disorder [F17.200] 04/27/2016  . Paranoid schizophrenia, chronic condition (HCC) [F20.0] 12/25/2013  . Alcohol abuse [F10.10] 12/25/2013   Total Time spent with patient: 30 minutes  Past Psychiatric History: schizophrenia  Past Medical History:  Past Medical History  Diagnosis Date  . Schizophrenia (HCC)     only known    History  reviewed. No pertinent past surgical history. Family History: History reviewed. No pertinent family history. Family Psychiatric  History: denies  Social History:  History  Alcohol Use  . 0.0 oz/week  . 1-2 Cans of beer per week    Comment: unknown - 04/22/16     History  Drug Use Not on file    Social History   Social History  . Marital Status: Single    Spouse Name: N/A  . Number of Children: N/A  . Years of Education: N/A   Social History Main Topics  . Smoking status: Current Every Day Smoker -- 1.00 packs/day    Types: Cigarettes  . Smokeless tobacco: None  . Alcohol Use: 0.0 oz/week    1-2 Cans of beer per week     Comment: unknown - 04/22/16  . Drug Use: None  . Sexual Activity: Not Asked   Other Topics Concern  . None   Social History Narrative     Current Medications: Current Facility-Administered Medications  Medication Dose Route Frequency Provider Last Rate Last Dose  . acetaminophen (TYLENOL) tablet 650 mg  650 mg Oral Q6H PRN Jimmy FootmanAndrea Hernandez-Gonzalez, MD      . alum & mag hydroxide-simeth (MAALOX/MYLANTA) 200-200-20 MG/5ML suspension 30 mL  30 mL Oral Q4H PRN Jimmy FootmanAndrea Hernandez-Gonzalez, MD      . diphenhydrAMINE (BENADRYL) capsule 50 mg  50 mg Oral QHS Jimmy FootmanAndrea Hernandez-Gonzalez, MD   50 mg at 04/29/16 2150  . feeding supplement (ENSURE ENLIVE) (ENSURE ENLIVE) liquid 237 mL  237 mL Oral TID BM Jimmy FootmanAndrea Hernandez-Gonzalez, MD   237 mL at 04/30/16 1000  .  LORazepam (ATIVAN) tablet 1 mg  1 mg Oral Q6H PRN Jimmy Footman, MD   1 mg at 04/28/16 2213  . magnesium hydroxide (MILK OF MAGNESIA) suspension 30 mL  30 mL Oral Daily PRN Jimmy Footman, MD      . multivitamin liquid 15 mL  15 mL Oral Daily Tiffani Rhae Lerner, MD   15 mL at 04/30/16 0850  . nicotine (NICODERM CQ - dosed in mg/24 hours) patch 21 mg  21 mg Transdermal Daily Jimmy Footman, MD   21 mg at 04/27/16 1621  . [START ON 05/10/2016] paliperidone (INVEGA SUSTENNA) injection  156 mg  156 mg Intramuscular Once Jimmy Footman, MD      . paliperidone (INVEGA) 24 hr tablet 12 mg  12 mg Oral Daily Jimmy Footman, MD   12 mg at 04/30/16 0850    Lab Results:  No results found for this or any previous visit (from the past 48 hour(s)).  Blood Alcohol level:  Lab Results  Component Value Date   ETH <5 04/22/2016   ETH 15* 12/23/2013    Physical Findings: AIMS:  , ,  ,  ,    CIWA:    COWS:     Musculoskeletal: Strength & Muscle Tone: within normal limits Gait & Station: normal Patient leans: N/A  Psychiatric Specialty Exam: Physical Exam  Constitutional: She is oriented to person, place, and time. She appears well-developed and well-nourished.  HENT:  Head: Normocephalic and atraumatic.  Eyes: EOM are normal.  Neck: Normal range of motion.  Respiratory: Effort normal.  Musculoskeletal: Normal range of motion.  Neurological: She is alert and oriented to person, place, and time.    Review of Systems  Constitutional: Negative.   HENT: Negative.   Eyes: Negative.   Respiratory: Negative.   Cardiovascular: Negative.   Gastrointestinal: Negative.   Genitourinary: Negative.   Musculoskeletal: Negative.   Skin: Negative.   Neurological: Negative.   Endo/Heme/Allergies: Negative.   Psychiatric/Behavioral: Negative.     Blood pressure 87/61, pulse 83, temperature 98.6 F (37 C), temperature source Oral, resp. rate 20, height 5' 7.5" (1.715 m), weight 43.999 kg (97 lb), SpO2 99 %.Body mass index is 14.96 kg/(m^2).  General Appearance: Bizarre and Disheveled  Eye Contact:  Poor  Speech:  Pressured  Volume:  Normal  Mood:  Anxious and Dysphoric  Affect:  Inappropriate  Thought Process:  Disorganized  Orientation:  Other:  self  Thought Content:  Hallucinations: Auditory Visual   Frequent obsessions about the FBI and tobacco companies  Suicidal Thoughts:  No  Homicidal Thoughts:  No  Memory:  Immediate;   Poor Recent;    Poor Remote;   Poor  Judgement:  Impaired  Insight:  Lacking  Psychomotor Activity:  Normal  Concentration:  Concentration: Poor  Recall:  Poor  Fund of Knowledge:  Poor  Language:  Fair  Akathisia:  No  Handed:  Right  AIMS (if indicated):     Assets:  Leisure Time Resilience Social Support  ADL's:  Impaired  Cognition:  Impaired,  Moderate  Sleep:  Number of Hours: 52   45 year old with schizophrenia and continued paranoia.  Treatment Plan Summary: Daily contact with patient to assess and evaluate symptoms and progress in treatment and Medication management   Schizophrenia patient will be continued on Invega 12 mg po qhs. Still delusional about the FBI and someone trying to kill her. Received invega sustenna 234 mg on 6/1.  Will order second invega on 6/5  For  insomnia: continue Ativan 1 mg by mouth daily at bedtime--Sleeping well  EPS: to prevent EPS continue Benadryl but will decrease to 25 mg   Patient appears malnourish: She has some delusions about the food thinking that will kill her. I have ordered ensure 3 times a day and multivitamins--per staff no eating meals but has been drinking ensures.  Tobacco use disorder: continue nicotine patch 21 mg a day  Labs: HA1c 5.2, TSH wnl, prolactin elevated at 79.3.  B12 is 261  Referral made to [facility]  Hospitalization status now IVC   Jimmy Footman, MD 04/30/2016, 12:09 PM

## 2016-04-30 NOTE — Progress Notes (Signed)
D: Observed pt in hallway on telephone talking loudly. Patient alert and oriented. Patient denies SI/HI/AVH. Pt affect blunted and anxious. Pt tangential and disorganized in conversation talking about [employer], cigarettes and babies. Pt had no complaints. When asked about group pt indicated she went but learned "nothing much." A: Offered active listening and support. Provided therapeutic communication. Administered scheduled medications.  R: Pt cooperative and redirectable. Pt medication compliant. Will continue Q15 min. checks. Safety maintained.

## 2016-04-30 NOTE — Progress Notes (Signed)
2010: Patient in bed resting. Alert and oriented but unable to engage in a meaningful conversation. Guarded and suspicious. "everything is fine". States her day went well, brief with poor eye contact. Received ensure as scheduled and drank it voluntarily. Was encouraged to communicate her needs and concerns to staff. Safety precautions maintained per unit protocol.  2300: Patient stayed in room. Medications were taken to her in room. Remains guarded, isolative in room. Safety precautions maintained.

## 2016-04-30 NOTE — Tx Team (Signed)
Interdisciplinary Treatment Plan Update (Adult)  Date:  04/30/2016 Time Reviewed:  4:03 PM  Progress in Treatment: Attending groups: No. Participating in groups:  No. Taking medication as prescribed:  Yes. Tolerating medication:  Yes. Family/Significant othe contact made:  No, will contact:  CSW has attempted to contact mother  Patient understands diagnosis:  No. and As evidenced by:  limited insight  Discussing patient identified problems/goals with staff:  Yes. Medical problems stabilized or resolved:  Yes. Denies suicidal/homicidal ideation: Yes. Issues/concerns per patient self-inventory:  Yes. Other:  New problem(s) identified: No, Describe:  NA  Discharge Plan or Barriers: Pt plans to return home and follow up with outpatient.    Reason for Continuation of Hospitalization: Delusions  Hallucinations Medication stabilization  Comments: Today patient trying to get a hold of the Social Security office in [place]. Says that she collects Social Security but at the same time tells me that she has never received a check. Her conversation is is still largely disorganized. Some of the statements are nonsensical. With redirection she can answer open ended questions. She is in agreement with taking Invega. Denies any side effects. States she has been eating her meals. States that she has been is sleeping well. Denies any side effects from medications.  Patient is still delusional about the police, the FBI, the CIA, tobacco companies and the [employer]  Pt says medications are helping her. Denies any SE. Denies hallucinations, SI or HI.   Estimated length of stay: 7 days   New goal(s):NA  Review of initial/current patient goals per problem list:   1.  Goal(s): Patient will participate in aftercare plan * Met:  * Target date: at discharge * As evidenced by: Patient will participate within aftercare plan AEB aftercare provider and housing plan at discharge being  identified  2.  Goal (s): Patient will demonstrate decreased symptoms of psychosis. * Met: No  *  Target date: at discharge * As evidenced by: Patient will not endorse signs of psychosis or be deemed stable for discharge by MD.   Attendees: Patient:  [name] [name] 6/2/20174:03 PM  Family:   6/2/20174:03 PM  Physician:  Dr. Jerilee Hoh   6/2/20174:03 PM  Nursing:   Seth Bake, RN  6/2/20174:03 PM  Case Manager:   6/2/20174:03 PM  Counselor:   6/2/20174:03 PM  Other:  Wray Kearns, LCSWA 6/2/20174:03 PM  Other:  Everitt Amber, [place]  6/2/20174:03 PM  Other:   6/2/20174:03 PM  Other:  6/2/20174:03 PM  Other:  6/2/20174:03 PM  Other:  6/2/20174:03 PM  Other:  6/2/20174:03 PM  Other:  6/2/20174:03 PM  Other:  6/2/20174:03 PM  Other:   6/2/20174:03 PM   Scribe for Treatment Team:   Wray Kearns MSW, [place] , 04/30/2016, 4:03 PM

## 2016-04-30 NOTE — [facility] Group Notes (Signed)
  [facility] LCSW Group Therapy  04/30/2016 1:51 PM  Type of Therapy:  Group Therapy  Participation Level:  Did Not Attend  Modes of Intervention:  Discussion, Education, Socialization and Support  Summary of Progress/Problems: Feelings around Relapse. Group members discussed the meaning of relapse and shared personal stories of relapse, how it affected them and others, and how they perceived themselves during this time. Group members were encouraged to identify triggers, warning signs and coping skills used when facing the possibility of relapse. Social supports were discussed and explored in detail.   Damarri Rampy L Paz Winsett MSW, LCSWA  04/30/2016, 1:51 PM  

## 2016-04-30 NOTE — [facility] Group Notes (Signed)
[facility] Group Notes:  (Nursing/MHT/Case Management/Adjunct)  Date:  04/30/2016  Time:  4:14 PM  Type of Therapy:  Group Therapy  Participation Level:  Did Not Attend  Roselynne Lortz De'Chelle Corie Allis 04/30/2016, 4:14 PM

## 2016-04-30 NOTE — Progress Notes (Signed)
Patient's conversations does not make ant sense.Talking about "stock exchange","[place]","[facility] etc.Stated that she feels good.No issues verbalized.Compliant with medications.Attended groups.

## 2016-05-01 NOTE — Progress Notes (Signed)
Patient with blunted affect, cooperative behavior with meals, meds and plan of care. Patient with minimal interaction with peers. Slight increase in interest in plan of care. Patient attends outdoor therapy group. Patient questions if she will be provided with a ride at time of discharge. No SI/HI at this time. Noted to talk to self at times. Safety maintained.

## 2016-05-01 NOTE — [facility] Group Notes (Signed)
[facility] Group Notes:  (Nursing/MHT/Case Management/Adjunct)  Date:  05/01/2016  Time:  9:25 AM  Type of Therapy:  Goal setting   Participation Level:  Did Not Attend  Twanna Hymanda C Ahamed Hofland 05/01/2016, 9:25 AM

## 2016-05-01 NOTE — Progress Notes (Signed)
Patient ID: [name], female   DOB: 07/21/1971, 45 y.o.   MRN: [id number]  CSW attempted PSA. Pt was unable to participate. CSW will attempt again later.   [name] FloroCandace L Amylynn [name] MSW, LCSWA  05/01/2016 2:14 PM

## 2016-05-01 NOTE — [facility] Group Notes (Signed)
[facility] LCSW Group Therapy  05/01/2016 1:37 PM  Type of Therapy:  Group Therapy  Participation Level:  Did Not Attend  Modes of Intervention:  Discussion, Education, Socialization and Support  Summary of Progress/Problems: Pt will identify unhealthy thoughts and how they impact their emotions and behavior. Pt will be encouraged to discuss these thoughts, emotions and behaviors with the group.   Jered Heiny L Moataz Tavis MSW, LCSWA  05/01/2016, 1:37 PM   

## 2016-05-01 NOTE — Plan of Care (Signed)
Problem: Activity: Goal: Interest or engagement in activities will improve Outcome: Progressing Patient cooperative with therapeutic [place] and routine.

## 2016-05-01 NOTE — Plan of Care (Signed)
Problem: Coping: Goal: Ability to verbalize feelings will improve Outcome: Not Progressing Pt is guarded on approach. She forwards little information with assessment.  Problem: Nutritional: Goal: Ability to achieve adequate nutritional intake will improve Outcome: Progressing Pt accepts ensures when offered.

## 2016-05-01 NOTE — Progress Notes (Signed)
[facility] MD Progress Note  05/01/2016 6:22 PM [name] [name]  MRN:  [id number]  This is a follow up of [name] [name] on 05/01/2016  Follow-up June 3 Saturday. Patient is a 45 year old woman with schizophrenia. On interview today she was completely disorganized. Her speech was entirely about her delusions. They weren't even very well-organized and skip from topic to topic constantly. She was not aggressive or violent however. Doesn't seem to be eating very much.  Subjective:   Today patient trying to get a hold of the Social Security office in [place].  Says that she collects Social Security but at the same time tells me that she has never received a check.  Her conversation is is still largely disorganized. Some of the statements are nonsensical. With redirection she can answer open ended questions. She is in agreement with taking Invega. Denies any side effects. States she has been eating her meals. States that she has been is sleeping well. Denies any side effects from medications.  Patient is still delusional about the police, the FBI, the CIA, tobacco companies and the [employer].  Pt says medications are helping her.  Denies any SE.  Denies hallucinations, SI or HI.   Per nursing:  D: Observed pt in hallway on telephone talking loudly. Patient alert and oriented. Patient denies SI/HI/AVH. Pt affect blunted and anxious. Pt tangential and disorganized in conversation talking about [employer], cigarettes and babies. Pt had no complaints. When asked about group pt indicated she went but learned "nothing much." A: Offered active listening and support. Provided therapeutic communication. Administered scheduled medications.  R: Pt cooperative and redirectable. Pt medication compliant. Will continue Q15 min. checks. Safety maintained.  Principal Problem: Paranoid schizophrenia, chronic condition (HCC) Diagnosis:   Patient Active Problem List   Diagnosis Date Noted  . Tobacco use disorder  [F17.200] 04/27/2016  . Paranoid schizophrenia, chronic condition (HCC) [F20.0] 12/25/2013  . Alcohol abuse [F10.10] 12/25/2013   Total Time spent with patient: 30 minutes  Past Psychiatric History: schizophrenia  Past Medical History:  Past Medical History  Diagnosis Date  . Schizophrenia (HCC)     only known    History reviewed. No pertinent past surgical history. Family History: History reviewed. No pertinent family history. Family Psychiatric  History: denies  Social History:  History  Alcohol Use  . 0.0 oz/week  . 1-2 Cans of beer per week    Comment: unknown - 04/22/16     History  Drug Use Not on file    Social History   Social History  . Marital Status: Single    Spouse Name: N/A  . Number of Children: N/A  . Years of Education: N/A   Social History Main Topics  . Smoking status: Current Every Day Smoker -- 1.00 packs/day    Types: Cigarettes  . Smokeless tobacco: None  . Alcohol Use: 0.0 oz/week    1-2 Cans of beer per week     Comment: unknown - 04/22/16  . Drug Use: None  . Sexual Activity: Not Asked   Other Topics Concern  . None   Social History Narrative     Current Medications: Current Facility-Administered Medications  Medication Dose Route Frequency Provider Last Rate Last Dose  . acetaminophen (TYLENOL) tablet 650 mg  650 mg Oral Q6H PRN Jimmy Footman, MD      . alum & mag hydroxide-simeth (MAALOX/MYLANTA) 200-200-20 MG/5ML suspension 30 mL  30 mL Oral Q4H PRN Jimmy Footman, MD      .  diphenhydrAMINE (BENADRYL) capsule 25 mg  25 mg Oral QHS Jimmy FootmanAndrea Hernandez-Gonzalez, MD   25 mg at 04/30/16 2210  . feeding supplement (ENSURE ENLIVE) (ENSURE ENLIVE) liquid 237 mL  237 mL Oral TID BM Jimmy FootmanAndrea Hernandez-Gonzalez, MD   237 mL at 05/01/16 1400  . LORazepam (ATIVAN) tablet 1 mg  1 mg Oral Q6H PRN Jimmy FootmanAndrea Hernandez-Gonzalez, MD   1 mg at 04/28/16 2213  . magnesium hydroxide (MILK OF MAGNESIA) suspension 30 mL  30 mL Oral Daily  PRN Jimmy FootmanAndrea Hernandez-Gonzalez, MD      . multivitamin liquid 15 mL  15 mL Oral Daily Tiffani Rhae LernerL Bell, MD   15 mL at 05/01/16 0855  . nicotine (NICODERM CQ - dosed in mg/24 hours) patch 21 mg  21 mg Transdermal Daily Jimmy FootmanAndrea Hernandez-Gonzalez, MD   21 mg at 04/27/16 1621  . [START ON 05/03/2016] paliperidone (INVEGA SUSTENNA) injection 156 mg  156 mg Intramuscular Once Jimmy FootmanAndrea Hernandez-Gonzalez, MD      . paliperidone (INVEGA) 24 hr tablet 12 mg  12 mg Oral Daily Jimmy FootmanAndrea Hernandez-Gonzalez, MD   12 mg at 05/01/16 0856    Lab Results:  No results found for this or any previous visit (from the past 48 hour(s)).  Blood Alcohol level:  Lab Results  Component Value Date   ETH <5 04/22/2016   ETH 15* 12/23/2013    Physical Findings: AIMS:  , ,  ,  ,    CIWA:    COWS:     Musculoskeletal: Strength & Muscle Tone: within normal limits Gait & Station: normal Patient leans: N/A  Psychiatric Specialty Exam: Physical Exam  Vitals reviewed. Constitutional: She is oriented to person, place, and time. She appears well-developed and well-nourished.  HENT:  Head: Normocephalic and atraumatic.  Eyes: EOM are normal.  Neck: Normal range of motion.  Respiratory: Effort normal.  Musculoskeletal: Normal range of motion.  Neurological: She is alert and oriented to person, place, and time.    Review of Systems  Constitutional: Negative.   HENT: Negative.   Eyes: Negative.   Respiratory: Negative.   Cardiovascular: Negative.   Gastrointestinal: Negative.   Genitourinary: Negative.   Musculoskeletal: Negative.   Skin: Negative.   Neurological: Negative.   Endo/Heme/Allergies: Negative.   Psychiatric/Behavioral: Negative.  Negative for depression, suicidal ideas, hallucinations and substance abuse. The patient is not nervous/anxious.     Blood pressure 90/58, pulse 128, temperature 98.7 F (37.1 C), temperature source Oral, resp. rate 20, height 5' 7.5" (1.715 m), weight 43.999 kg (97 lb), SpO2  99 %.Body mass index is 14.96 kg/(m^2).  General Appearance: Bizarre and Disheveled  Eye Contact:  Poor  Speech:  Pressured  Volume:  Normal  Mood:  Anxious and Dysphoric  Affect:  Inappropriate  Thought Process:  Disorganized  Orientation:  Other:  self  Thought Content:  Hallucinations: Auditory Visual   Frequent obsessions about the FBI and tobacco companies  Suicidal Thoughts:  No  Homicidal Thoughts:  No  Memory:  Immediate;   Poor Recent;   Poor Remote;   Poor  Judgement:  Impaired  Insight:  Lacking  Psychomotor Activity:  Normal  Concentration:  Concentration: Poor  Recall:  Poor  Fund of Knowledge:  Poor  Language:  Fair  Akathisia:  No  Handed:  Right  AIMS (if indicated):     Assets:  Leisure Time Resilience Social Support  ADL's:  Impaired  Cognition:  Impaired,  Moderate  Sleep:  Number of Hours: 7.75   45 year  old with schizophrenia and continued paranoia.  Treatment Plan Summary: Daily contact with patient to assess and evaluate symptoms and progress in treatment and Medication management   Schizophrenia patient will be continued on Invega 12 mg po qhs. Still delusional about the FBI and someone trying to kill her. Received invega sustenna 234 mg on 6/1.  Will order second invega on 6/5  For insomnia: continue Ativan 1 mg by mouth daily at bedtime--Sleeping well  EPS: to prevent EPS continue Benadryl but will decrease to 25 mg   Patient appears malnourish: She has some delusions about the food thinking that will kill her. I have ordered ensure 3 times a day and multivitamins--per staff no eating meals but has been drinking ensures.  Tobacco use disorder: continue nicotine patch 21 mg a day  Labs: HA1c 5.2, TSH wnl, prolactin elevated at 79.3.  B12 is 261  Referral made to [facility]  Hospitalization status now IVC Continue antipsychotic medicine as prescribed. Encourage patient to please eat and drink better. I think her low blood pressure is probably from  not eating and drinking well. No other change to treatment plan for today.   Mordecai Rasmussen, MD 05/01/2016, 6:22 PM

## 2016-05-02 NOTE — Progress Notes (Signed)
2013: patient has been in her room resting. Currently in the dayroom, walking around. Alert and oriented and thought process a little bit more clear. Patient in more approachable but continues to appear bizarre with poor hygiene. Refusing to take showers. Has no sign of distress. Received her scheduled ensure and drank it voluntarily. Emotional support and encouragements offered. Safety precautions maintained on the unit.

## 2016-05-02 NOTE — Progress Notes (Signed)
Patient ID: [name], female   DOB: 06/04/1971, 45 y.o.   MRN: [id number]  CSW spoke with pt's mother. Pt's mother reports she does well while on medications, however she stops taking them after a month. While on medications, she showers regularly and can have a coherent conversation. [facility], pt has not been on any medications nor has seen a psychiatrist. Mother attempted many times to get her hospitalized but failed due to pt running away or refusing to go. CSW will make a APS report prior to discharge. Mother reports pt was deemed incompetent while living in [place] but does not have a guardian. Mother is her payee which happened recently.   Rondall AllegraCandace L Judas [name], LCSWA  05/02/2016 10:57 AM

## 2016-05-02 NOTE — [facility] Suicide Risk Assessment (Signed)
[facility] INPATIENT:  Family/Significant Other Suicide Prevention Education  Suicide Prevention Education:  Education Completed; [name] SaxHelen [name] (mother) has been identified by the patient as the family member/significant other with whom the patient will be residing, and identified as the person(s) who will aid the patient in the event of a mental health crisis (suicidal ideations/suicide attempt).  With written consent from the patient, the family member/significant other has been provided the following suicide prevention education, prior to the and/or following the discharge of the patient.  The suicide prevention education provided includes the following:  Suicide risk factors  Suicide prevention and interventions  National Suicide Hotline telephone number  [facility] assessment telephone number  [facility] Emergency Assistance 911  [facility] and/or Residential Mobile Crisis Unit telephone number  Request made of family/significant other to:  Remove weapons (e.g., guns, rifles, knives), all items previously/currently identified as safety concern.    Remove drugs/medications (over-the-counter, prescriptions, illicit drugs), all items previously/currently identified as a safety concern.  The family member/significant other verbalizes understanding of the suicide prevention education information provided.  The family member/significant other agrees to remove the items of safety concern listed above.  Jailen Lung L Allicia Culley MSW, LCSWA  05/02/2016, 10:32 AM

## 2016-05-02 NOTE — Plan of Care (Signed)
Problem: Nutritional: Goal: Ability to achieve adequate nutritional intake will improve Outcome: Progressing Patient had her ensure as scheduled and had snacks at bed time. Appetite improving

## 2016-05-02 NOTE — Progress Notes (Signed)
[facility]  05/02/2016 6:44 PM [name] RossettiWendy [name]  MRN:  [id number]  This is a follow up of [name] RossettiWendy [name] on 05/02/2016 Follow-up Sunday the fourth. Whether it's a real improvement or just the ups and downs of her illness she was less bizarre today. She did not start immediately talking with me about all of her delusions. Much more calm. Still poor hygiene poor insight. Blood pressure low but she is not complaining of anything doesn't appear dizzy. Oral intake still not very good.  Subjective:   Today patient trying to get a hold of the Social Security office in [place].  Says that she collects Social Security but at the same time tells me that she has never received a check.  Her conversation is is still largely disorganized. Some of the statements are nonsensical. With redirection she can answer open ended questions. She is in agreement with taking Invega. Denies any side effects. States she has been eating her meals. States that she has been is sleeping well. Denies any side effects from medications.  Patient is still delusional about the police, the FBI, the CIA, tobacco companies and the [employer] exchange market.  Pt says medications are helping her.  Denies any SE.  Denies hallucinations, SI or HI.   Per nursing:  D: Observed pt in hallway on telephone talking loudly. Patient alert and oriented. Patient denies SI/HI/AVH. Pt affect blunted and anxious. Pt tangential and disorganized in conversation talking about [employer] bus, cigarettes and babies. Pt had no complaints. When asked about group pt indicated she went but learned "nothing much." A: Offered active listening and support. Provided therapeutic communication. Administered scheduled medications.  R: Pt cooperative and redirectable. Pt medication compliant. Will continue Q15 min. checks. Safety maintained.  Principal Problem: Paranoid schizophrenia, chronic condition (HCC) Diagnosis:   Patient Active Problem List   Diagnosis Date  Noted  . Tobacco use disorder [F17.200] 04/27/2016  . Paranoid schizophrenia, chronic condition (HCC) [F20.0] 12/25/2013  . Alcohol abuse [F10.10] 12/25/2013   Total Time spent with patient: 30 minutes  Past Psychiatric History: schizophrenia  Past Medical History:  Past Medical History  Diagnosis Date  . Schizophrenia (HCC)     only known    History reviewed. No pertinent past surgical history. Family History: History reviewed. No pertinent family history. Family Psychiatric  History: denies  Social History:  History  Alcohol Use  . 0.0 oz/week  . 1-2 Cans of beer per week    Comment: unknown - 04/22/16     History  Drug Use Not on file    Social History   Social History  . Marital Status: Single    Spouse Name: N/A  . Number of Children: N/A  . Years of Education: N/A   Social History Main Topics  . Smoking status: Current Every Day Smoker -- 1.00 packs/day    Types: Cigarettes  . Smokeless tobacco: None  . Alcohol Use: 0.0 oz/week    1-2 Cans of beer per week     Comment: unknown - 04/22/16  . Drug Use: None  . Sexual Activity: Not Asked   Other Topics Concern  . None   Social History Narrative     Current Medications: Current Facility-Administered Medications  Medication Dose Route Frequency Provider Last Rate Last Dose  . acetaminophen (TYLENOL) tablet 650 mg  650 mg Oral Q6H PRN Jimmy FootmanAndrea Hernandez-Gonzalez, MD      . alum & mag hydroxide-simeth (MAALOX/MYLANTA) 200-200-20 MG/5ML suspension 30 mL  30 mL  Oral Q4H PRN Jimmy Footman, MD      . diphenhydrAMINE (BENADRYL) capsule 25 mg  25 mg Oral QHS Jimmy Footman, MD   25 mg at 05/01/16 2106  . feeding supplement (ENSURE ENLIVE) (ENSURE ENLIVE) liquid 237 mL  237 mL Oral TID BM Jimmy Footman, MD   237 mL at 05/02/16 1400  . LORazepam (ATIVAN) tablet 1 mg  1 mg Oral Q6H PRN Jimmy Footman, MD   1 mg at 04/28/16 2213  . magnesium hydroxide (MILK OF MAGNESIA)  suspension 30 mL  30 mL Oral Daily PRN Jimmy Footman, MD      . multivitamin liquid 15 mL  15 mL Oral Daily Lockie Pares, MD   15 mL at 05/02/16 0909  . nicotine (NICODERM CQ - dosed in mg/24 hours) patch 21 mg  21 mg Transdermal Daily Jimmy Footman, MD   21 mg at 04/27/16 1621  . [START ON 05/03/2016] paliperidone (INVEGA SUSTENNA) injection 156 mg  156 mg Intramuscular Once Jimmy Footman, MD      . paliperidone (INVEGA) 24 hr tablet 12 mg  12 mg Oral Daily Jimmy Footman, MD   12 mg at 05/02/16 0909    Lab Results:  No results found for this or any previous visit (from the past 48 hour(s)).  Blood Alcohol level:  Lab Results  Component Value Date   ETH <5 04/22/2016   ETH 15* 12/23/2013    Physical Findings: AIMS:  , ,  ,  ,    CIWA:    COWS:     Musculoskeletal: Strength & Muscle Tone: within normal limits Gait & Station: normal Patient leans: N/A  Psychiatric Specialty Exam: Physical Exam  Vitals reviewed. Constitutional: She is oriented to person, place, and time. She appears well-developed and well-nourished.  HENT:  Head: Normocephalic and atraumatic.  Eyes: EOM are normal.  Neck: Normal range of motion.  Respiratory: Effort normal.  Musculoskeletal: Normal range of motion.  Neurological: She is alert and oriented to person, place, and time.    Review of Systems  Constitutional: Negative.   HENT: Negative.   Eyes: Negative.   Respiratory: Negative.   Cardiovascular: Negative.   Gastrointestinal: Negative.   Genitourinary: Negative.   Musculoskeletal: Negative.   Skin: Negative.   Neurological: Negative.   Endo/Heme/Allergies: Negative.   Psychiatric/Behavioral: Negative.  Negative for depression, suicidal ideas, hallucinations and substance abuse. The patient is not nervous/anxious.     Blood pressure 81/57, pulse 126, temperature 98.2 F (36.8 C), temperature source Oral, resp. rate 20, height 5' 7.5" (1.715  m), weight 43.999 kg (97 lb), SpO2 99 %.Body mass index is 14.96 kg/(m^2).  General Appearance: Bizarre and Disheveled  Eye Contact:  Poor  Speech:  Pressured  Volume:  Normal  Mood:  Anxious and Dysphoric  Affect:  Inappropriate  Thought Process:  Disorganized  Orientation:  Other:  self  Thought Content:  Hallucinations: Auditory Visual   Frequent obsessions about the FBI and tobacco companies  Suicidal Thoughts:  No  Homicidal Thoughts:  No  Memory:  Immediate;   Poor Recent;   Poor Remote;   Poor  Judgement:  Impaired  Insight:  Lacking  Psychomotor Activity:  Normal  Concentration:  Concentration: Poor  Recall:  Poor  Fund of Knowledge:  Poor  Language:  Fair  Akathisia:  No  Handed:  Right  AIMS (if indicated):     Assets:  Leisure Time Resilience Social Support  ADL's:  Impaired  Cognition:  Impaired,  Moderate  Sleep:  Number of Hours: 43   45 year old with schizophrenia and continued paranoia.  Treatment Plan Summary: Daily contact with patient to assess and evaluate symptoms and progress in treatment and Medication management   Schizophrenia patient will be continued on Invega 12 mg po qhs. Still delusional about the FBI and someone trying to kill her. Received invega sustenna 234 mg on 6/1.  Will order second invega on 6/5  For insomnia: continue Ativan 1 mg by mouth daily at bedtime--Sleeping well  EPS: to prevent EPS continue Benadryl but will decrease to 25 mg   Patient appears malnourish: She has some delusions about the food thinking that will kill her. I have ordered ensure 3 times a day and multivitamins--per staff no eating meals but has been drinking ensures.  Tobacco use disorder: continue nicotine patch 21 mg a day  Labs: HA1c 5.2, TSH wnl, prolactin elevated at 79.3.  B12 is 261  Referral made to [facility]  Hospitalization status now IVC Continue current medication. Continue overall treatment plan. Try to get her to eat a little bit more. Still  appears however to be stable with her current eating habits.   Mordecai Rasmussen, MD 05/02/2016, 6:44 PM

## 2016-05-02 NOTE — [facility] Group Notes (Signed)
[facility] LCSW Group Therapy  05/02/2016 3:15 PM  Type of Therapy:  Group Therapy  Participation Level:  Did Not Attend  Modes of Intervention:  Discussion, Education, Socialization and Support  Summary of Progress/Problems: Emotional Regulation: Patients will identify both negative and positive emotions. They will discuss emotions they have difficulty regulating and how they impact their lives. Patients will be asked to identify healthy coping skills to combat unhealthy reactions to negative emotions.     Burleigh Brockmann L Jamiah Homeyer MSW, LCSWA  05/02/2016, 3:15 PM    

## 2016-05-02 NOTE — Progress Notes (Signed)
20:00 patient was in room at the beginning of the shift. Alert and oriented and denying SI and hallucinations. Guarded and avoiding detailed conversations.Suspicious with brief eye contact. Experiencing poor hygiene as she refuses to shower.  Received her scheduled ensure and drank it voluntarily.Was encouraged to go to the dayroom and to report her feelings and concern to staff as needed.  Safety precautions maintained. 2200: Patient seen in the dayroom. Walking around and eating a snack. Presented to nurses room for vital signs. BP: 95/52, pulse 96. Received Gatorade and drank it voluntarily. Bed time medication was taken to her in room. Patient is more cooperative. Staff continue to monitor and to provide support. 0500: Patient has remained asleep. No sign of discomfort. Safety maintained on the unit.

## 2016-05-02 NOTE — Progress Notes (Signed)
Patient with blunted affect, cooperative with meds. Fair appetite, drinks nutritional shake as provided. Minimal interaction with peers. Paces unit halls, attends outside therapy group briefly. No SI/HI/AVH at this time. Increasingly appropriate, asks questions about her discharge plan. Safety maintained.

## 2016-05-02 NOTE — Plan of Care (Signed)
Problem: Coping: Goal: Ability to verbalize feelings will improve Outcome: Progressing Patient more approachable and has began expressing her needs and concerns.

## 2016-05-02 NOTE — Progress Notes (Signed)
D: Pt remains isolative in her room this evening. Affect is blunted. Pt forwards little upon assessment. She denies SI/HI/AVH at this time. Denies pain. Pt accepts ensures when offered. She does not get up for snacks. A: Emotional support and encouragement provided. Medications administered with education. q15 minute safety checks maintained. R: Pt remains free from harm. Will continue to monitor.
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
## 2016-05-03 NOTE — Plan of Care (Signed)
Problem: Activity: Goal: Interest or engagement in activities will improve Outcome: Not Progressing Patient with poor hygiene, refuses shower.

## 2016-05-03 NOTE — [facility] Group Notes (Signed)
[facility] LCSW Group Therapy   05/03/2016 9:30am Type of Therapy: Group Therapy   Participation Level: Active   Participation Quality: Attentive, Sharing and Supportive   Affect: Depressed and Flat   Cognitive: Alert and Oriented   Insight: Developing/Improving and Engaged   Engagement in Therapy: Developing/Improving and Engaged   Modes of Intervention: Clarification, Confrontation, Discussion, Education, Exploration,  Limit-setting, Orientation, Problem-solving, Rapport Building, Dance movement psychotherapisteality Testing, Socialization and Support   Summary of Progress/Problems: Pt identified obstacles faced currently and processed barriers involved in overcoming these obstacles. Pt was to identifiy steps necessary for overcoming these obstacles and explored motivation (internal and external) for facing these difficulties head on. Pt was to further identify one area of concern in their lives and chose a goal to focus on for today. Although the pt by contrast to previous sessions presented as improving as evidenced by the pt being calm and replying to prompts from the CSW, although the prompts were mostly unintelligible the pt at times responded correctly to questions posed by the CSW.   The pt did, at times, leave and then return to the group room during sharing.  Dorothe PeaJonathan F. Georgianna Band, LCSWA, LCAS  05/03/16

## 2016-05-03 NOTE — Plan of Care (Signed)
Problem: Health Behavior/Discharge Planning: Goal: Compliance with prescribed medication regimen will improve Outcome: Progressing Pt taking medications as prescribed.  Problem: Role Relationship: Goal: Ability to interact with others will improve Outcome: Not Progressing Pt remains isolative in her room this evening. She is not seen interacting with other pts.

## 2016-05-03 NOTE — Progress Notes (Addendum)
[facility] MD Progress Note  05/03/2016 12:34 PM [name] [name]  MRN:  [id number]  This is a follow up of [name] [name] on 05/03/2016   Subjective:   Today patient trying to get a hold of the Social Security office in [place].  Says that she collects Social Security but at the same time tells me that she has never received a check.  Her conversation is is still largely disorganized. Some of the statements are nonsensical. With redirection she can answer open ended questions. She is in agreement with taking Invega. Denies any side effects. States she has been eating her meals. States that she has been is sleeping well. Denies any side effects from medications.  Patient is still delusional about the police, the FBI, the CIA, tobacco companies and the [employer]. This delusions however are not as intense as they were before. Today during assessment the patient did not bring them out for discussion.  Hygiene and grooming continued to be poor. Patient has a strong body odor, poor dental hygiene, her clothes are dirty and her her is disheveled. Nursing staff has been trying to encourage her to shower. Patient tells them she is showering however she is coming out of the room with her her dry and same clothes.  She has been compliant with medications. She feels that medications help her. She denies problems with mood, appetite, energy is sleep or concentration. Denies auditory or visual hallucinations and denies suicidality homicidality   Per nursing:  Patient with flat affect, cooperative with meds. Patient with poor adls and refuses shower and washing clothes. Patient avoids shower by walking out of room and then later states to nurse I showered. Patient's hair is dry. Manual vital signs monitored and recorded. Minimal interaction with peers. No SI/HI at this time. Safety maintained.   Principal Problem: Paranoid schizophrenia, chronic condition (HCC) Diagnosis:   Patient Active Problem List   Diagnosis Date Noted  . Tobacco use disorder [F17.200] 04/27/2016  . Paranoid schizophrenia, chronic condition (HCC) [F20.0] 12/25/2013  . Alcohol abuse [F10.10] 12/25/2013   Total Time spent with patient: 30 minutes  Past Psychiatric History: schizophrenia  Past Medical History:  Past Medical History  Diagnosis Date  . Schizophrenia (HCC)     only known    History reviewed. No pertinent past surgical history. Family History: History reviewed. No pertinent family history. Family Psychiatric  History: denies  Social History:  History  Alcohol Use  . 0.0 oz/week  . 1-2 Cans of beer per week    Comment: unknown - 04/22/16     History  Drug Use Not on file    Social History   Social History  . Marital Status: Single    Spouse Name: N/A  . Number of Children: N/A  . Years of Education: N/A   Social History Main Topics  . Smoking status: Current Every Day Smoker -- 1.00 packs/day    Types: Cigarettes  . Smokeless tobacco: None  . Alcohol Use: 0.0 oz/week    1-2 Cans of beer per week     Comment: unknown - 04/22/16  . Drug Use: None  . Sexual Activity: Not Asked   Other Topics Concern  . None   Social History Narrative     Current Medications: Current Facility-Administered Medications  Medication Dose Route Frequency Provider Last Rate Last Dose  . acetaminophen (TYLENOL) tablet 650 mg  650 mg Oral Q6H PRN Jimmy Footman, MD      . alum & mag hydroxide-simeth (  MAALOX/MYLANTA) 200-200-20 MG/5ML suspension 30 mL  30 mL Oral Q4H PRN Jimmy Footman, MD      . diphenhydrAMINE (BENADRYL) capsule 25 mg  25 mg Oral QHS Jimmy Footman, MD   25 mg at 05/02/16 2247  . feeding supplement (ENSURE ENLIVE) (ENSURE ENLIVE) liquid 237 mL  237 mL Oral TID BM Jimmy Footman, MD   237 mL at 05/02/16 2012  . LORazepam (ATIVAN) tablet 1 mg  1 mg Oral Q6H PRN Jimmy Footman, MD   1 mg at 04/28/16 2213  . magnesium hydroxide (MILK OF  MAGNESIA) suspension 30 mL  30 mL Oral Daily PRN Jimmy Footman, MD      . multivitamin liquid 15 mL  15 mL Oral Daily Lockie Pares, MD   15 mL at 05/02/16 0909  . nicotine (NICODERM CQ - dosed in mg/24 hours) patch 21 mg  21 mg Transdermal Daily Jimmy Footman, MD   21 mg at 04/27/16 1621  . paliperidone (INVEGA SUSTENNA) injection 156 mg  156 mg Intramuscular Once Jimmy Footman, MD      . paliperidone (INVEGA) 24 hr tablet 12 mg  12 mg Oral Daily Jimmy Footman, MD   12 mg at 05/03/16 0825    Lab Results:  No results found for this or any previous visit (from the past 48 hour(s)).  Blood Alcohol level:  Lab Results  Component Value Date   ETH <5 04/22/2016   ETH 15* 12/23/2013    Physical Findings: AIMS:  , ,  ,  ,    CIWA:    COWS:     Musculoskeletal: Strength & Muscle Tone: within normal limits Gait & Station: normal Patient leans: N/A  Psychiatric Specialty Exam: Physical Exam  Vitals reviewed. Constitutional: She is oriented to person, place, and time. She appears well-developed and well-nourished.  HENT:  Head: Normocephalic and atraumatic.  Eyes: EOM are normal.  Neck: Normal range of motion.  Respiratory: Effort normal.  Musculoskeletal: Normal range of motion.  Neurological: She is alert and oriented to person, place, and time.    Review of Systems  Constitutional: Negative.   HENT: Negative.   Eyes: Negative.   Respiratory: Negative.   Cardiovascular: Negative.   Gastrointestinal: Negative.   Genitourinary: Negative.   Musculoskeletal: Negative.   Skin: Negative.   Neurological: Negative.   Endo/Heme/Allergies: Negative.   Psychiatric/Behavioral: Negative.  Negative for depression, suicidal ideas, hallucinations and substance abuse. The patient is not nervous/anxious.     Blood pressure 90/70, pulse 70, temperature 98.8 F (37.1 C), temperature source Oral, resp. rate 20, height 5' 7.5" (1.715 m), weight  43.999 kg (97 lb), SpO2 99 %.Body mass index is 14.96 kg/(m^2).  General Appearance: Bizarre and Disheveled  Eye Contact:  Poor  Speech:  Pressured  Volume:  Normal  Mood:  Anxious and Dysphoric  Affect:  Inappropriate  Thought Process:  Disorganized  Orientation:  Other:  self  Thought Content:  Hallucinations: Auditory Visual   Frequent obsessions about the FBI and tobacco companies  Suicidal Thoughts:  No  Homicidal Thoughts:  No  Memory:  Immediate;   Poor Recent;   Poor Remote;   Poor  Judgement:  Impaired  Insight:  Lacking  Psychomotor Activity:  Normal  Concentration:  Concentration: Poor  Recall:  Poor  Fund of Knowledge:  Poor  Language:  Fair  Akathisia:  No  Handed:  Right  AIMS (if indicated):     Assets:  Leisure Time Resilience Social Support  ADL's:  Impaired  Cognition:  Impaired,  Moderate  Sleep:  Number of Hours: [phone]   45 year old with schizophrenia and continued paranoia.  Treatment Plan Summary: Daily contact with patient to assess and evaluate symptoms and progress in treatment and Medication management   Schizophrenia patient will be continued on Invega 12 mg po qhs. Still delusional about the FBI and someone trying to kill her. Received invega sustenna 234 mg on 6/1.  Will order second invega 156 mg today.    For insomnia: continue Ativan 1 mg by mouth daily at bedtime--Sleeping well  EPS: to prevent EPS continue Benadryl but will decrease to 25 mg   Patient appears malnourish: She has some delusions about the food thinking that will kill her. I have ordered ensure 3 times a day and multivitamins--per staff she is eating some  Tobacco use disorder: continue nicotine patch 21 mg a day  Labs: HA1c 5.2, TSH wnl, prolactin elevated at 79.3.  B12 is 261   Hospitalization status now IVC  Social worker to make contact with the patient's mother in order to start making preparations for discharge. Discharge most likely will happen in the next 48  hours    Jimmy FootmanHernandez-Gonzalez,  Kebra Lowrimore, MD 05/03/2016, 12:34 PM

## 2016-05-03 NOTE — Progress Notes (Addendum)
Patient with flat affect, cooperative with meds. Patient with poor adls and refuses shower and washing clothes. Patient avoids shower by walking out of room and then later states to nurse I showered. Patient's hair is dry.  Manual vital signs monitored and recorded. Minimal interaction with peers. No SI/HI at this time. Safety maintained. Patient calm and cooperative this afternoon, in dayroom with peers, has nutritional shake. Patient rests in bed during free time. Safety maintained.

## 2016-05-03 NOTE — Progress Notes (Signed)
Recreation Therapy Notes  Date: 06.05.17 Time: 9:30 am Location: Craft Room  Group Topic: Self-expression  Goal Area(s) Addresses:  Patient will identify one color per emotion listed on wheel. Patient will verbalize one emotion experienced during session. Patient will be educated on other forms of self-expression.  Behavioral Response: Did not attend  Intervention: Emotion Wheel  Activity: Patients were given an Emotion Wheel worksheet and instructed to pick a color for each emotion listed on the wheel.  Education: LRT educated patients on other forms of self-expression.  Education Outcome: Patient did not attend group.  Clinical Observations/Feedback: Patient did not attend group.  Jacquelynn CreeGreene,Cephus Tupy M, LRT/CTRS 05/03/2016 10:13 AM

## 2016-05-04 ENCOUNTER — Encounter: Payer: Self-pay | Admitting: Psychiatry

## 2016-05-04 DIAGNOSIS — H548 Legal blindness, as defined in USA: Secondary | ICD-10-CM

## 2016-05-04 MED ORDER — PALIPERIDONE PALMITATE 234 MG/1.5ML IM SUSP: 234.0000 mg | Freq: Once | INTRAMUSCULAR | Status: DC

## 2016-05-04 MED ORDER — PALIPERIDONE ER 6 MG PO TB24: 12.0000 mg | ORAL_TABLET | Freq: Every day | ORAL | Status: DC

## 2016-05-04 NOTE — Progress Notes (Signed)
D: Pt remains isolative in her room most of this evening. Affect is preoccupied and blunted. Pt denies SI/HI/AVH at this time. Denies pain. No questions or concerns at this time. A: Emotional support and encouragement provided. Medications administered with education. q15 minute safety checks maintained. R: Pt remains free from harm. Will continue to monitor.

## 2016-05-04 NOTE — Progress Notes (Signed)
Patient noted talking with self and laughing in hallway. Med and group compliant. No interaction with peers. Minimal interaction with staff. Med compliant. Will continue to assess and monitor. Pt remains safe on unit with q 15 min checks.

## 2016-05-04 NOTE — Progress Notes (Signed)
[facility] MD Progress Note  05/04/2016 10:39 AM [name] [name]  MRN:  [id number]  This is a follow up of [name] [name] on 05/04/2016   Subjective:   Today patient trying to get a hold of the Social Security office in [place].  Says that she collects Social Security but at the same time tells me that she has never received a check.  Her conversation is is still largely disorganized. Some of the statements are nonsensical. With redirection she can answer open ended questions. She is in agreement with taking Invega. Denies any side effects. States she has been eating her meals. States that she has been is sleeping well. Denies any side effects from medications.  Patient is still delusional about the police, the FBI, the CIA, tobacco companies and the [employer]. Her delusions are saw disorganized that is very hard to understand what she means. Today she was talking about the [employer] bus and combining that with issues related to [employer]. Social worker finally was able to contact the patient's mother whom she lives with.  We will try to invite the mother for a family meeting.    We are also trying to refer the patient for ACT services due to the severity of her illness and symptoms.  Patient is clearly showing signs of significant self-care deficits. She is was malnourish, disheveled, has poor Administrator. She has neglected her overall hygiene.. Nursing staff has been trying to encourage her to shower. Patient tells them she is showering however she is coming out of the room with her her dry and same clothes.  She has been compliant with medications. She feels that medications help her. She denies problems with mood, appetite, energy is sleep or concentration. Denies auditory or visual hallucinations and denies suicidality homicidality   Per nursing:  D: Pt remains isolative in her room most of this evening. Affect is preoccupied and blunted. Pt denies SI/HI/AVH at this time. Denies pain.  No questions or concerns at this time. A: Emotional support and encouragement provided. Medications administered with education. q15 minute safety checks maintained. R: Pt remains free from harm. Will continue to monitor.            Principal Problem: Paranoid schizophrenia, chronic condition (HCC) Diagnosis:   Patient Active Problem List   Diagnosis Date Noted  . Tobacco use disorder [F17.200] 04/27/2016  . Paranoid schizophrenia, chronic condition (HCC) [F20.0] 12/25/2013  . Alcohol abuse [F10.10] 12/25/2013   Total Time spent with patient: 30 minutes  Past Psychiatric History: schizophrenia  Past Medical History:  Past Medical History  Diagnosis Date  . Schizophrenia (HCC)     only known    History reviewed. No pertinent past surgical history. Family History: History reviewed. No pertinent family history. Family Psychiatric  History: denies  Social History:  History  Alcohol Use  . 0.0 oz/week  . 1-2 Cans of beer per week    Comment: unknown - 04/22/16     History  Drug Use Not on file    Social History   Social History  . Marital Status: Single    Spouse Name: N/A  . Number of Children: N/A  . Years of Education: N/A   Social History Main Topics  . Smoking status: Current Every Day Smoker -- 1.00 packs/day    Types: Cigarettes  . Smokeless tobacco: None  . Alcohol Use: 0.0 oz/week    1-2 Cans of beer per week     Comment: unknown - 04/22/16  .  Drug Use: None  . Sexual Activity: Not Asked   Other Topics Concern  . None   Social History Narrative     Current Medications: Current Facility-Administered Medications  Medication Dose Route Frequency Provider Last Rate Last Dose  . acetaminophen (TYLENOL) tablet 650 mg  650 mg Oral Q6H PRN Jimmy Footman, MD      . alum & mag hydroxide-simeth (MAALOX/MYLANTA) 200-200-20 MG/5ML suspension 30 mL  30 mL Oral Q4H PRN Jimmy Footman, MD      . diphenhydrAMINE (BENADRYL) capsule 25 mg   25 mg Oral QHS Jimmy Footman, MD   25 mg at 05/03/16 2210  . feeding supplement (ENSURE ENLIVE) (ENSURE ENLIVE) liquid 237 mL  237 mL Oral TID BM Jimmy Footman, MD   237 mL at 05/04/16 1000  . LORazepam (ATIVAN) tablet 1 mg  1 mg Oral Q6H PRN Jimmy Footman, MD   1 mg at 04/28/16 2213  . magnesium hydroxide (MILK OF MAGNESIA) suspension 30 mL  30 mL Oral Daily PRN Jimmy Footman, MD      . multivitamin liquid 15 mL  15 mL Oral Daily Lockie Pares, MD   15 mL at 05/02/16 0909  . nicotine (NICODERM CQ - dosed in mg/24 hours) patch 21 mg  21 mg Transdermal Daily Jimmy Footman, MD   21 mg at 04/27/16 1621  . paliperidone (INVEGA) 24 hr tablet 12 mg  12 mg Oral Daily Jimmy Footman, MD   12 mg at 05/04/16 0913    Lab Results:  No results found for this or any previous visit (from the past 48 hour(s)).  Blood Alcohol level:  Lab Results  Component Value Date   ETH <5 04/22/2016   ETH 15* 12/23/2013    Physical Findings: AIMS:  , ,  ,  ,    CIWA:    COWS:     Musculoskeletal: Strength & Muscle Tone: within normal limits Gait & Station: normal Patient leans: N/A  Psychiatric Specialty Exam: Physical Exam  Vitals reviewed. Constitutional: She is oriented to person, place, and time. She appears well-developed and well-nourished.  HENT:  Head: Normocephalic and atraumatic.  Eyes: EOM are normal.  Neck: Normal range of motion.  Respiratory: Effort normal.  Musculoskeletal: Normal range of motion.  Neurological: She is alert and oriented to person, place, and time.    Review of Systems  Constitutional: Negative.   HENT: Negative.   Eyes: Negative.   Respiratory: Negative.   Cardiovascular: Negative.   Gastrointestinal: Negative.   Genitourinary: Negative.   Musculoskeletal: Negative.   Skin: Negative.   Neurological: Negative.   Endo/Heme/Allergies: Negative.   Psychiatric/Behavioral: Negative.  Negative  for depression, suicidal ideas, hallucinations and substance abuse. The patient is not nervous/anxious.     Blood pressure 95/47, pulse 70, temperature 97.9 F (36.6 C), temperature source Oral, resp. rate 20, height 5' 7.5" (1.715 m), weight 43.999 kg (97 lb), SpO2 99 %.Body mass index is 14.96 kg/(m^2).  General Appearance: Bizarre and Disheveled  Eye Contact:  Poor  Speech:  Pressured  Volume:  Normal  Mood:  Anxious and Dysphoric  Affect:  Inappropriate  Thought Process:  Disorganized  Orientation:  Other:  self  Thought Content:  Hallucinations: Auditory Visual   Frequent obsessions about the FBI and tobacco companies  Suicidal Thoughts:  No  Homicidal Thoughts:  No  Memory:  Immediate;   Poor Recent;   Poor Remote;   Poor  Judgement:  Impaired  Insight:  Lacking  Psychomotor Activity:  Normal  Concentration:  Concentration: Poor  Recall:  Poor  Fund of Knowledge:  Poor  Language:  Fair  Akathisia:  No  Handed:  Right  AIMS (if indicated):     Assets:  Leisure Time Resilience Social Support  ADL's:  Impaired  Cognition:  Impaired,  Moderate  Sleep:  Number of Hours: [phone]   45 year old with schizophrenia and continued paranoia.  Treatment Plan Summary: Daily contact with patient to assess and evaluate symptoms and progress in treatment and Medication management   Schizophrenia patient will be continued on Invega 12 mg po qhs. Still delusional and disorganized. Received invega sustenna 234 mg on 6/1. Received second invega 156 mg on 6/5.   For insomnia: continue Ativan 1 mg by mouth daily at bedtime--Sleeping well  EPS: no evidence of EPS will d/c benadryl.  Patient appears malnourish: She has some delusions about the food thinking that will kill her. I have ordered ensure 3 times a day and multivitamins--per staff she is eating about one meal per day  Tobacco use disorder: continue nicotine patch 21 mg a day  Labs: HA1c 5.2, TSH wnl, prolactin elevated at 79.3.   B12 is 261  Hospitalization status now IVC  Social worker: Has finally been able to get a hold of a family member, the patient's mother. We will try to him by the mother for a family meeting prior to discharge.  We are considering referring the patient for ACT services  We are also considering and Adult Protective Services referral for prior to doing these we would like to meet with the family.   Jimmy FootmanHernandez-Gonzalez,  [name] Battaglini, MD 05/04/2016, 10:39 AM
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
## 2016-05-04 NOTE — Progress Notes (Signed)
Recreation Therapy Notes  Date: 06.06.17 Time: 9:30 am Location: Craft Room  Group Topic: Goal Setting  Goal Area(s) Addresses:  Patient will identify at least one goal. Patient will identify at least one obstacle.  Behavioral Response: Attentive, Left early  Intervention: Recovery Goal Chart  Activity: Patients were instructed to make a Recovery Goal Chart including goals, obstacles, the date they started working on their goals, and the date they achieved their goals.  Education: LRT educated patients on ways they can celebrate in a healthy way.  Education Outcome: Patient left before LRT educated group.   Clinical Observations/Feedback: Patient left group at approximately 9:45 am with Dr. Ardyth HarpsHernandez. Patient did not return to group.  Jacquelynn CreeGreene,Milayna Rotenberg M, LRT/CTRS 05/04/2016 10:18 AM

## 2016-05-04 NOTE — Plan of Care (Signed)
Problem: Nutritional: Goal: Ability to achieve adequate nutritional intake will improve Outcome: Progressing Pt takes ensure shakes when offered.   Problem: Self-Care: Goal: Ability to participate in self-care as condition permits will improve Outcome: Not Progressing Pt refuses to shower.
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
## 2016-05-05 NOTE — [facility] Group Notes (Signed)
[facility] LCSW Group Therapy  05/05/2016 2:09 PM  Type of Therapy:  Group Therapy  Participation Level:  Did Not Attend  Modes of Intervention:  Discussion, Education, Socialization and Support  Summary of Progress/Problems: Self esteem: Patients discussed self esteem and how it impacts them. They discussed what aspects in their lives has influenced their self esteem. They were challenged to identify changes that are needed in order to improve self esteem.    Elmond Poehlman L Journie Howson MSW, LCSWA  05/05/2016, 2:09 PM   

## 2016-05-05 NOTE — Progress Notes (Addendum)
Patient with blunted affect, guarded when staff initiates interaction. No SI/HI/AVH at this time. Patient remains preoccupied by her thoughts. Slight improvement in interest in plan of care. Therapy groups encouraged. Patient with fair appetite, nutritional shakes as ordered. Poor adls, refuses shower, refuses to wash clothes. Minimal interaction preformed. Safety maintained. Patient returns to bed during free time, keeping curtains in room drawn closed.
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
## 2016-05-05 NOTE — Progress Notes (Signed)
Recreation Therapy Notes  Date: 06.07.17 Time: 9:30 am Location: Craft Room  Group Topic: Self-esteem  Goal Area(s) Addresses:  Patient will write at least one positive trait about self. Patient will verbalize benefit of having a healthy self-esteem.  Behavioral Response: Intermittently Attentive, Left early  Intervention: I Am  Activity: Patients were given a worksheet with the letter I on it and instructed to write as many positive traits inside the letter.  Education: LRT educated patients on ways they can increase their self-esteem.  Education Outcome: Patient left before LRT educated group.  Clinical Observations/Feedback: Patient wrote "sight" on her worksheet. Patient did not contribute to group discussion. Patient left group at approximately 10:10 am to use the restroom. Patient did not return to group.  Jacquelynn CreeGreene,Dace Denn M, LRT/CTRS 05/05/2016 10:26 AM
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
## 2016-05-05 NOTE — Progress Notes (Signed)
D: Pt is seen in the milieu more this evening. She continues to maintain minimal interaction with peers. Denies SI/HI/AVH at this time. Pt does not appear to be responding to internal stimuli. Denies pain. A: Emotional support and encouragement provided. Medications administered with education. q15 minute safety checks maintained. R: Pt remains free from harm. Will continue to monitor.

## 2016-05-05 NOTE — Plan of Care (Signed)
Problem: Activity: Goal: Interest or engagement in activities will improve Outcome: Not Progressing Patient with blunted affect. Guarded with staff. Unable/unwilling to complete daily goal sheet.
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
## 2016-05-06 NOTE — Progress Notes (Signed)
D:  Per pt self inventory pt reports sleeping good, appetite good , energy level normal, ability to pay attention good, rates depression at a 0 out of 10, hopelessness at a 0 out of 10, anxiety at a 0 out of 10, denies SI/HI/AVH, pt appears disheveled and has body odor, rapid speech, poor eye contact during interaction but appropriate.     A:  Emotional support provided, Encouraged pt to continue with treatment plan and attend all group activities, q15 min checks maintained for safety.  R:  Pt does not feel like she needs to be here and is looking forward to discharge, going to groups, pleasant and cooperative with staff and other patients on the unit.

## 2016-05-06 NOTE — [facility] Group Notes (Signed)
[facility] LCSW Group Therapy  05/06/2016 3:00 PM  Type of Therapy:  Group Therapy  Participation Level:  Did Not Attend  Modes of Intervention:  Discussion, Education, Socialization and Support  Summary of Progress/Problems: Balance in life: Patients will discuss the concept of balance and how it looks and feels to be unbalanced. Pt will identify areas in their life that is unbalanced and ways to become more balanced.    Oluwadamilare Tobler L Shaela Boer MSW, LCSWA  05/06/2016, 3:00 PM   
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
## 2016-05-06 NOTE — B[facility] Group Notes (Signed)
[facility] Group Notes:  (Nursing/MHT/Case Management/Adjunct)  Date:  05/06/2016  Time:  6:16 PM  Type of Therapy:  Psychoeducational Skills  Participation Level:  Active  Participation Quality:  Appropriate  Affect:  Appropriate  Cognitive:  Appropriate  Insight:  Appropriate  Engagement in Group:  Engaged and Supportive  Modes of Intervention:  Education, Exploration and Support  Summary of Progress/Problems:  Benay PikeJamila A Dequavius Kuhner 05/06/2016, 6:16 PM

## 2016-05-06 NOTE — Progress Notes (Signed)
D: Pt continues to display a blunted affect. She is guarded upon approach. Denies SI/HI/AVH at this time. Denies pain. She continues to take ensure shakes as ordered. A: Emotional support and encouragement provided. Medications administered with education. q15 minute safety checks maintained. R: Pt remains free from harm. Will continue to monitor.

## 2016-05-06 NOTE — [facility] Group Notes (Signed)
[facility] Group Notes:  (Nursing/MHT/Case Management/Adjunct)  Date:  05/06/2016  Time:  11:16 PM  Type of Therapy:  Psychoeducational Skills  Participation Level:  Did Not Attend   Summary of Progress/Problems:  [name] MilroyLaquanda Y Rylynn [name] 05/06/2016, 11:16 PM
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
## 2016-05-06 NOTE — BHH Group Notes (Signed)
[facility] Group Notes:  (Nursing/MHT/Case Management/Adjunct)  Date:  05/06/2016  Time:  6:10 PM  Type of Therapy:  Psychoeducational Skills  Participation Level:  Active  Participation Quality:  Appropriate, Attentive and Sharing  Affect:  Appropriate  Cognitive:  Appropriate  Insight:  Appropriate  Engagement in Group:  Engaged and Supportive  Modes of Intervention:  Education, Exploration and Support  Summary of Progress/Problems:  Benay PikeJamila A Chalmer Zheng 05/06/2016, 6:10 PM

## 2016-05-06 NOTE — Plan of Care (Signed)
Problem: Safety: Goal: Ability to remain free from injury will improve Outcome: Progressing Pt remains free from harm.  Problem: Self-Care: Goal: Ability to participate in self-care as condition permits will improve Outcome: Not Progressing Pt does not appear to be showering. She refuses to wash clothes.

## 2016-05-06 NOTE — Progress Notes (Signed)
Recreation Therapy Notes  Date: 06.08.17 Time: 9:30 am Location: Craft Room  Group Topic: Leisure Education  Goal Area(s) Addresses:  Patient will identify things they are grateful for. Patient will verbalize why it is important to be grateful.  Behavioral Response: Attentive  Intervention: LexicographerGrateful Wheel  Activity: Patients were given an I Am Grateful For worksheet and instructed to write things they are grateful for under each category.  Education: LRT educated patients on why it is important to be grateful.  Education Outcome: In group clarification offered   Clinical Observations/Feedback: Patient wrote things like "sight", "IRS", "[place]", "[place]", "Chrissie NoaWilliam and Coyne CenterPrince Harry", and "Chrissie NoaWilliam and [place]". Patient did not contribute to group discussion.  Jacquelynn CreeGreene,Zalea Pete M, LRT/CTRS 05/06/2016 10:30 AM

## 2016-05-07 NOTE — Progress Notes (Signed)
  [facility] :  Will you be returning to the same living situation after discharge:  Yes,  the pt will [place] with [place] mother At discharge, do you have transportation home?: Yes,  pt will be picked up by her mother Do you have the ability to pay for your medications: Yes,  pt will be provided with prescriptions at discharge  Release of information consent forms completed and in the chart;  Patient's signature needed at discharge.  Patient to Follow up at: Follow-up Information    Go to [facility] [place].   Why:  Please arrive to the walk-in clinic Monday through Friday from 9-4pm for your assessment for your hospital follow up for medication management and therapy.   Contact information:   [address] [place], [place] [address] Phone: [phone] Fax: [phone]      Follow up with [facility].   Why:  The ACT Team will assess you at your home immediately after discharge services for your services for medication managment, substance abuse treatment and therapy.  If you experience a crisis please call the crisis line at ph: [phone]   Contact information:   [address] [place], [place] [address] Ph: [phone] Crisis Line: [phone] Fax: [phone]      Call Adult Protective Services.   Why:  A report has been filed with [facility] [employer] [place] and Salome Spottedia Turner has been assigned to Miss Koone's case to decided if legal guardianship can be pursued with the department of social services. They will be in contact with you   Contact information:   [address]  [place], [place] [address] [phone] Fax: [phone]       Next level of care provider has access to [facility] Link:no  Safety Planning and Suicide Prevention discussed: Yes,  Completed with pt  Have you used any form of tobacco in the last 30 days? (Cigarettes, Smokeless Tobacco,  Cigars, and/or Pipes): Yes  Has patient been referred to the Quitline?: Patient refused referral  Patient has been referred for addiction treatment: Yes  Dorothe PeaJonathan F Elliot Simoneaux 05/07/2016, 3:36 PM

## 2016-05-07 NOTE — Progress Notes (Signed)
D: Observed pt pacing the hallway. Patient alert and oriented . Patient denies SI/HI/AVH. Pt affect is flat and anxious. Pt stated her mood was "ok." Pt rated depression 0/10 and anxiety 0/10. Pt forwarded little and mentioned briefly about being discharged tomorrow. A: Offered active listening and support. Provided therapeutic communication. Administered scheduled medications.  R: Pt pleasant and cooperative. Pt medication compliant. Will continue Q15 min. checks. Safety maintained.

## 2016-05-07 NOTE — Progress Notes (Signed)
Recreation Therapy Notes  Date: 06.09.17 Time: 9:30 am Location: Craft Room  Group Topic: Coping Skills  Goal Area(s) Addresses:  Patient will participate in coping skill. Patient will verbalize benefit of art as a coping skill.  Behavioral Response: Did not attend  Intervention: Coloring  Activity: Patients were given coloring sheets to color and were asked to think about the emotions they were feeling and what their mind was focused on.  Education: LRT educated patients on healthy coping skills.   Education Outcome: Patient did not attend group.   Clinical Observations/Feedback: Patient came to group and left before LRT started group. Patient did not return to group.  Jacquelynn CreeGreene,Elyssia Strausser M, LRT/CTRS 05/07/2016 10:20 AM

## 2016-05-07 NOTE — Progress Notes (Signed)
Patient denies SI/HI, denies A/V hallucinations. Patient verbalizes understanding of discharge instructions, follow up care and prescriptions. Patient given all belongings from  locker. Patient escorted out by staff, transported by family. 

## 2016-05-07 NOTE — Plan of Care (Signed)
Problem: Coping: Goal: Ability to verbalize feelings will improve Outcome: Not Progressing Pt forwarded little in regards to feelings or thoughts this shift.
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
## 2016-05-12 ENCOUNTER — Emergency Department (HOSPITAL_COMMUNITY)
Admission: EM | Admit: 2016-05-12 | Discharge: 2016-05-13 | Disposition: A | Discharge status: Other Acute Inpatient Hospital | Payer: Medicaid Other | Attending: Emergency Medicine | Admitting: Emergency Medicine

## 2016-05-12 ENCOUNTER — Encounter (HOSPITAL_COMMUNITY): Payer: Self-pay | Admitting: Emergency Medicine

## 2016-05-12 DIAGNOSIS — D649 Anemia, unspecified: Secondary | ICD-10-CM | POA: Insufficient documentation

## 2016-05-12 DIAGNOSIS — F200 Paranoid schizophrenia: Secondary | ICD-10-CM | POA: Diagnosis not present

## 2016-05-12 DIAGNOSIS — F17200 Nicotine dependence, unspecified, uncomplicated: Secondary | ICD-10-CM

## 2016-05-12 DIAGNOSIS — F22 Delusional disorders: Secondary | ICD-10-CM

## 2016-05-12 DIAGNOSIS — R460 Very low level of personal hygiene: Secondary | ICD-10-CM

## 2016-05-12 DIAGNOSIS — F17210 Nicotine dependence, cigarettes, uncomplicated: Secondary | ICD-10-CM | POA: Diagnosis not present

## 2016-05-12 DIAGNOSIS — R440 Auditory hallucinations: Secondary | ICD-10-CM | POA: Diagnosis present

## 2016-05-12 DIAGNOSIS — Z046 Encounter for general psychiatric examination, requested by authority: Secondary | ICD-10-CM

## 2016-05-12 DIAGNOSIS — R443 Hallucinations, unspecified: Secondary | ICD-10-CM

## 2016-05-12 LAB — CBC
HCT: 35.1 % — ABNORMAL LOW (ref 36.0–46.0)
Hemoglobin: 11.6 g/dL — ABNORMAL LOW (ref 12.0–15.0)
MCH: 30.8 pg (ref 26.0–34.0)
MCHC: 33 g/dL (ref 30.0–36.0)
MCV: 93.1 fL (ref 78.0–100.0)
PLATELETS: 258 10*3/uL (ref 150–400)
RBC: 3.77 MIL/uL — AB (ref 3.87–5.11)
RDW: 14.5 % (ref 11.5–15.5)
WBC: 7.3 10*3/uL (ref 4.0–10.5)

## 2016-05-12 LAB — COMPREHENSIVE METABOLIC PANEL
ALBUMIN: 4 g/dL (ref 3.5–5.0)
ALK PHOS: 45 U/L (ref 38–126)
ALT: 54 U/L (ref 14–54)
ANION GAP: 6 (ref 5–15)
AST: 30 U/L (ref 15–41)
BILIRUBIN TOTAL: 0.7 mg/dL (ref 0.3–1.2)
BUN: 15 mg/dL (ref 6–20)
CO2: 24 mmol/L (ref 22–32)
Calcium: 8.6 mg/dL — ABNORMAL LOW (ref 8.9–10.3)
Chloride: 106 mmol/L (ref 101–111)
Creatinine, Ser: 0.76 mg/dL (ref 0.44–1.00)
GFR calc Af Amer: >60 mL/min (ref >60)
GFR calc non Af Amer: >60 mL/min (ref >60)
GLUCOSE: 96 mg/dL (ref 65–99)
POTASSIUM: 3.9 mmol/L (ref 3.5–5.1)
SODIUM: 136 mmol/L (ref 135–145)
TOTAL PROTEIN: 7.2 g/dL (ref 6.5–8.1)

## 2016-05-12 LAB — RAPID URINE DRUG SCREEN, HOSP PERFORMED
Amphetamines: NOT DETECTED
BENZODIAZEPINES: NOT DETECTED
Barbiturates: NOT DETECTED
Cocaine: NOT DETECTED
OPIATES: NOT DETECTED
Tetrahydrocannabinol: NOT DETECTED

## 2016-05-12 LAB — ACETAMINOPHEN LEVEL: Acetaminophen (Tylenol), Serum: <10 ug/mL — ABNORMAL LOW (ref 10–30)

## 2016-05-12 LAB — SALICYLATE LEVEL

## 2016-05-12 LAB — I-STAT BETA HCG BLOOD, ED (MC, WL, AP ONLY)

## 2016-05-12 LAB — ETHANOL

## 2016-05-12 MED ORDER — ALUM & MAG HYDROXIDE-SIMETH 200-200-20 MG/5ML PO SUSP: 30.0000 mL | ORAL | Status: DC | PRN

## 2016-05-12 MED ORDER — LORAZEPAM 1 MG PO TABS: 1.0000 mg | ORAL_TABLET | Freq: Three times a day (TID) | ORAL | Status: DC | PRN

## 2016-05-12 MED ORDER — ZOLPIDEM TARTRATE 5 MG PO TABS: 5.0000 mg | ORAL_TABLET | Freq: Every evening | ORAL | Status: DC | PRN

## 2016-05-12 MED ORDER — ONDANSETRON HCL 4 MG PO TABS: 4.0000 mg | ORAL_TABLET | Freq: Three times a day (TID) | ORAL | Status: DC | PRN

## 2016-05-12 MED ORDER — ACETAMINOPHEN 325 MG PO TABS: 650.0000 mg | ORAL_TABLET | ORAL | Status: DC | PRN

## 2016-05-12 MED ORDER — NICOTINE 21 MG/24HR TD PT24: 21.0000 mg | MEDICATED_PATCH | Freq: Every day | TRANSDERMAL | Status: DC

## 2016-05-12 MED ORDER — IBUPROFEN 200 MG PO TABS: 600.0000 mg | ORAL_TABLET | Freq: Three times a day (TID) | ORAL | Status: DC | PRN

## 2016-05-12 NOTE — ED Notes (Signed)
[facility] Danger to self and others. Mobile Crisis believes patient is schizophrenic. Mother reports patient is hearing voices, expressing delusional thinking. Thinks people are trying to kill her, claims she has spoken with president washington, clinton, and lincoln. Not eating or sleeping, running away from home.

## 2016-05-12 NOTE — BH Assessment (Signed)
Tele Assessment Note   Steven Veazie is a 45 y.o. female who presented under IVC (petition filed by a mobile crisis team); she presented to [facility] with significant delusion and in an apparent state of self-neglect.  Pt was recently released from inpatient treatment at [facility] where she was treated for schizophrenic symptoms (delusion, hallucination).  Pt was oriented to person and time, but not to situation or place.  She believed she was in a jail and that [name] was a Emergency planning/management officer.  Pt was very poor historian and assessment was difficult.  Pt's speech was very tangential and non-responsive to questions asked.  Pt expressed belief that she was being poisoned with bones, that human bones were being slipped into coffee, that "they" wanted to abuse bones when "we died."  Pt spoke of knowing [name], taking money from [employer], and not accepting "[employer]" money.  Pt's speech content moved quickly from concerns about the government to where she lived ("I live in a trailer, so I know I won't die").  Pt indicated that she lives with her mother, and that she does not understand why she is at the hospital ("I'm going to jail ... Take me to jail... They don't make you take off your clothes").    During assessment, Pt rocked back and forth.  She appeared disheveled and seemed to have bad hygiene.  She had good eye contact and demeanor was pleasant and talkative.  Pt denied suicidal or homicidal ideation, and she denied self-injury.  Pt denied auditory/visual hallucination, but it was apparent to author that Pt was responding to internal stimuli before the assessment began.  Pt denied depressive symptoms.  However, per report, Pt is not eating or sleeping, and she is neglecting basic self-care.  Pt's thought processes were incoherent and illogical; thought content indicated the presence of somatic and bizarre delusion.  There was no evidence of substance use.  Pt's concentration was poor; memory was  also deemed poor.  Speech was tangential.  Judgment and insight were deemed poor; impulse control could not be determined.  Per previous assessment (dated 04/22/16), Pt neglects her hygiene (teeth rotting), refuses to go to the doctor, and is non-compliant with medication.    Consulted with Ander Slade, NP, who determined that Pt meets inpatient criteria.  Diagnosis: Schizophrenia  Past Medical History:  Past Medical History  Diagnosis Date  . Schizophrenia (HCC)     only known     History reviewed. No pertinent past surgical history.  Family History: No family history on file.  Social History:  reports that she has been smoking Cigarettes.  She has been smoking about 1.00 pack per day. She does not have any smokeless tobacco history on file. She reports that she drinks alcohol. Her drug history is not on file.  Additional Social History:  Alcohol / Drug Use Pain Medications: See PTA Prescriptions: See PTA Over the Counter: See PTA History of alcohol / drug use?: No history of alcohol / drug abuse  CIWA: CIWA-Ar BP: 118/66 mmHg Pulse Rate: 109 COWS:    PATIENT STRENGTHS: (choose at least two) Communication skills General fund of knowledge Physical Health  Allergies:  Allergies  Allergen Reactions  . Abilify [Aripiprazole]     Aggressive behavior    . Sulfa Antibiotics Other (See Comments)    unknown    Home Medications:  (Not in a hospital admission)  OB/GYN Status:  No LMP recorded.  General Assessment Data [facility] of [facility]: [facility] TTS  Assessment: In system Is this a Tele or Face-to-Face Assessment?: Tele Assessment Is this an Initial Assessment or a Re-assessment for this encounter?: Initial Assessment Marital status: Single Is patient pregnant?: No Pregnancy Status: No Living Arrangements: Parent Can pt return to current living arrangement?: Yes Admission Status: Involuntary (Petition filed by [employer]) Is patient capable of signing voluntary  admission?: Yes Referral Source: Other Insurance type: [place] MCD  Medical Screening Exam [facility] Walk-in ONLY) Medical Exam completed: Yes  Crisis Care Plan Living Arrangements: Parent Name of Psychiatrist: None Name of Therapist: None  Education Status Is patient currently in school?: No Highest grade of school patient has completed: 10th grade   Risk to self with the past 6 months Suicidal Ideation: No Has patient been a risk to self within the past 6 months prior to admission? : No Suicidal Intent: No Has patient had any suicidal intent within the past 6 months prior to admission? : No Is patient at risk for suicide?: No Suicidal Plan?: No Has patient had any suicidal plan within the past 6 months prior to admission? : No Access to Means: No What has been your use of drugs/alcohol within the last 12 months?: Cigarettes Previous Attempts/Gestures: Yes How many times?: 2 (Per previous report) Triggers for Past Attempts: Unknown Intentional Self Injurious Behavior: None Family Suicide History: Unknown Recent stressful life event(s): Other (Comment) (UTA) Persecutory voices/beliefs?: No (Pt denied, but observed Pt responding to internal stimuli) Depression: No Substance abuse history and/or treatment for substance abuse?: No Suicide prevention information given to non-admitted patients: Not applicable  Risk to Others within the past 6 months Homicidal Ideation: No Does patient have any lifetime risk of violence toward others beyond the six months prior to admission? : No Thoughts of Harm to Others: No Current Homicidal Intent: No Current Homicidal Plan: No Access to Homicidal Means: No History of harm to others?: No Assessment of Violence: None Noted Does patient have access to weapons?: No Criminal Charges Pending?: No Does patient have a court date: No Is patient on probation?: No  Psychosis Hallucinations: Auditory (Pt denied; but appeared to respond to internal  stimuli) Delusions: Unspecified, Somatic  Mental Status Report Appearance/Hygiene: Bizarre, Disheveled, Poor hygiene Eye Contact: Good Motor Activity: Gestures Speech: Tangential Level of Consciousness: Alert Mood: Preoccupied Affect: Preoccupied Anxiety Level: None Thought Processes: Tangential, Flight of Ideas Judgement: Impaired Orientation: Person, Time Obsessive Compulsive Thoughts/Behaviors: None  Cognitive Functioning Concentration: Unable to Assess Memory: Unable to Assess IQ: Average Insight: Poor Impulse Control: Unable to Assess Appetite: Poor Sleep: Decreased Vegetative Symptoms: Decreased grooming  ADLScreening [facility] Assessment Services) Patient's cognitive ability adequate to safely complete daily activities?: Yes Patient able to express need for assistance with ADLs?: Yes Independently performs ADLs?: Yes (appropriate for developmental age)  Prior Inpatient Therapy Prior Inpatient Therapy: Yes Prior Therapy Dates: 2015, 2017 Prior Therapy Facilty/Provider(s): [facility] Reason for Treatment: Schizophrenia  Prior Outpatient Therapy Prior Outpatient Therapy: No Prior Therapy Dates: N/A Prior Therapy Facilty/Provider(s): N/A Reason for Treatment: N/A Does patient have an ACCT team?: Unknown Does patient have Intensive In-House Services?  : Unknown Does patient have [facility] services? : Unknown Does patient have P4CC services?: Unknown  ADL Screening (condition at time of admission) Patient's cognitive ability adequate to safely complete daily activities?: Yes Is the patient deaf or have difficulty hearing?: No Does the patient have difficulty seeing, even when wearing glasses/contacts?: No Does the patient have difficulty concentrating, remembering, or making decisions?: No Patient able to express need for  assistance with ADLs?: Yes Does the patient have difficulty dressing or bathing?: No Independently performs ADLs?: Yes (appropriate for developmental  age) Does the patient have difficulty walking or climbing stairs?: No Weakness of Legs: None Weakness of Arms/Hands: None       Abuse/Neglect Assessment (Assessment to be complete while patient is alone) Physical Abuse: Denies (Pt rambling, not responsive to questions) Verbal Abuse: Denies Sexual Abuse: Denies Exploitation of patient/patient's resources: Denies Self-Neglect: Denies, provider concerned (Comment) (Per Pt's mobile crisis team, Pt is not sleeping/eating) Values / Beliefs Cultural Requests During Hospitalization: None Spiritual Requests During Hospitalization: None Consults Spiritual Care Consult Needed: No Social Work Consult Needed: No Merchant navy officerAdvance Directives (For Healthcare) Does patient have an advance directive?: No Would patient like information on creating an advanced directive?: No - patient declined information    Additional Information 1:1 In Past 12 Months?: No CIRT Risk: No Elopement Risk: No Does patient have medical clearance?: Yes     Disposition:  Disposition Initial Assessment Completed for this Encounter: Yes Disposition of Patient: Inpatient treatment program Type of inpatient treatment program: Adult (Per T. [place], Pt meets inpt criteria)  Earline Mayotteugene T Milisa Kimbell 05/12/2016 6:37 PM

## 2016-05-12 NOTE — Progress Notes (Signed)
This writer completed a chart review for disposition.    Albany Winslow, MSW, LCSW, LCAS [facility] Triage Specialist [phone] 

## 2016-05-12 NOTE — Progress Notes (Signed)
Attempted to secure placement for the following facilities:  [facility]  [facility] if [facility].   Maryelizabeth Rowanressa Jerrell Hart, MSW, Clare CharonLCSW, LCAS [facility] Triage Specialist [phone]

## 2016-05-12 NOTE — ED Notes (Signed)
Pt under IVC presents responding to internal stimuli.  Pt rambling, talking loud.  Denies SI or HI.  Pt is poor historian.  Monitoring for safety, Q 15 min checks in effect.  Pt awake, alert & responsive, no distress noted.

## 2016-05-12 NOTE — ED Notes (Signed)
Bed: WBH34 Expected date:  Expected time:  Means of arrival:  Comments: Triage 6 

## 2016-05-12 NOTE — ED Provider Notes (Signed)
CSN: [id number]     Arrival date & time 05/12/16  1650 History   First MD Initiated Contact with Patient 05/12/16 1724     Chief Complaint  Patient presents with  . Medical Clearance    IVC     (Consider location/radiation/quality/duration/timing/severity/associated sxs/prior Treatment) HPI Comments: Cloie Wooden is a 45 y.o. female with a PMHx of schizophrenia, who presents to the ED via [employer] who were called out after IVC paperwork was taken out on the patient. IVC paperwork states "pt is a danger to self and other, to wit: mobile crisis worker believes respondent is schizophrenic; mother reports she seems to be hearing voices; petitioner reports she is expressing delusional thinking including people trying to kill her, claiming she has spoken with presidents [place], [place], and [place]; using an aggressive tone and saying 'who do I have to call? Hitler? Bill and [place]?' Not eating or sleeping; ran away today."  LEVEL 5 CAVEAT DUE TO PSYCHIATRIC CONDITION, pt very tangential and rambles about President Francesco Sor and Marrian Salvage, jumping from topic to topic without a clear unifying thought between them. She denies SI/HI/AVH to me, but seems to be speaking to someone who isn't in the room. Denies EtOH and illicit drug use, but admits to smoking 1ppd. Denies being on any medications, can't recall the last dose of her Hinda Glatter which is the only medication we have on file. Denies other complaints, specifically denying fevers, chills, CP, SOB, abd pain, N/V/D/C, hematuria, dysuria, numbness, tingling, or weakness. Chart review reveals she was seen 04/22/16 for psychiatric condition, and had psych admission at that time.   Patient is a 45 y.o. female presenting with mental health disorder. The history is provided by the patient, medical records and the police. The history is limited by the condition of the patient. No language interpreter was used.  Mental Health Problem Presenting symptoms:  delusional, disorganized speech, disorganized thought process and hallucinations (per IVC paperwork)   Presenting symptoms: no homicidal ideas and no suicidal thoughts   Patient accompanied by:  Law enforcement Degree of incapacity (severity):  Severe Onset quality:  Unable to specify Timing:  Unable to specify Progression:  Unable to specify Chronicity:  Chronic Context: noncompliance   Treatment compliance:  Untreated Relieved by:  None tried Worsened by:  Nothing tried Ineffective treatments:  None tried Associated symptoms: no abdominal pain and no chest pain   Risk factors: hx of mental illness and recent psychiatric admission     Past Medical History  Diagnosis Date  . Schizophrenia (HCC)     only known    History reviewed. No pertinent past surgical history. No family history on file. Social History  Substance Use Topics  . Smoking status: Current Every Day Smoker -- 1.00 packs/day    Types: Cigarettes  . Smokeless tobacco: None  . Alcohol Use: 0.0 oz/week    1-2 Cans of beer per week     Comment: unknown - 04/22/16   OB History    No data available      LEVEL 5 CAVEAT DUE TO PSYCHIATRIC CONDITION Review of Systems  Unable to perform ROS: Psychiatric disorder  Constitutional: Negative for fever.  Respiratory: Negative for shortness of breath.   Cardiovascular: Negative for chest pain.  Gastrointestinal: Negative for nausea, vomiting, abdominal pain, diarrhea and constipation.  Genitourinary: Negative for dysuria and hematuria.  Neurological: Negative for weakness and numbness.  Psychiatric/Behavioral: Positive for hallucinations (per IVC paperwork) and behavioral problems. Negative for suicidal ideas and homicidal ideas.  Allergies  Abilify and Sulfa antibiotics  Home Medications   Prior to Admission medications   Medication Sig Start Date End Date Taking? Authorizing Provider  paliperidone (INVEGA SUSTENNA) 234 MG/1.5ML SUSP injection Inject 234  mg into the muscle once. Patient not taking: Reported on 05/12/2016 05/28/16   Jimmy Footman, MD  paliperidone (INVEGA) 6 MG 24 hr tablet Take 2 tablets (12 mg total) by mouth daily. Patient not taking: Reported on 05/12/2016 05/04/16   Jimmy Footman, MD   BP 118/66 mmHg  Pulse 109  Temp(Src) 98.4 F (36.9 C) (Oral)  Resp 18  SpO2 100% Physical Exam  Constitutional: Vital signs are normal. She appears well-developed and well-nourished.  Non-toxic appearance. No distress.  Afebrile, nontoxic, NAD, tangential speech, disheveled with poor hygiene.  HENT:  Head: Normocephalic and atraumatic.  Mouth/Throat: Oropharynx is clear and moist and mucous membranes are normal.  Poor oral dentitia diffusely  Eyes: Conjunctivae and EOM are normal. Right eye exhibits no discharge. Left eye exhibits no discharge.  Neck: Normal range of motion. Neck supple.  Cardiovascular: Normal rate, regular rhythm, normal heart sounds and intact distal pulses.  Exam reveals no gallop and no friction rub.   No murmur heard. Mildly tachycardic initially which resolved during exam  Pulmonary/Chest: Effort normal and breath sounds normal. No respiratory distress. She has no decreased breath sounds. She has no wheezes. She has no rhonchi. She has no rales.  Abdominal: Soft. Normal appearance and bowel sounds are normal. She exhibits no distension. There is no tenderness. There is no rigidity, no rebound and no guarding.  Musculoskeletal: Normal range of motion.  Neurological: She is alert. She has normal strength. No sensory deficit.  Skin: Skin is warm, dry and intact. No rash noted.  Psychiatric: Her affect is blunt. Her speech is rapid and/or pressured and tangential. She is actively hallucinating. Thought content is delusional. She expresses no homicidal and no suicidal ideation. She expresses no suicidal plans and no homicidal plans.  Blunt affect, rapid speech which is very tangential, rambles  about different prior presidents and states she needs to speak with them, denies HI/SI, denies hallucinations but seems to respond to either auditory or visual hallucinations, turns and talks to someone who isn't in the room. Delusional thought content  Nursing note and vitals reviewed.   ED Course  Procedures (including critical care time) Labs Review Labs Reviewed  COMPREHENSIVE METABOLIC PANEL - Abnormal; Notable for the following:    Calcium 8.6 (*)    All other components within normal limits  ACETAMINOPHEN LEVEL - Abnormal; Notable for the following:    Acetaminophen (Tylenol), Serum <10 (*)    All other components within normal limits  CBC - Abnormal; Notable for the following:    RBC 3.77 (*)    Hemoglobin 11.6 (*)    HCT 35.1 (*)    All other components within normal limits  ETHANOL  SALICYLATE LEVEL  URINE RAPID DRUG SCREEN, HOSP PERFORMED  I-STAT BETA HCG BLOOD, ED ([facility], [facility], [facility] ONLY)    Imaging Review No results found. I have personally reviewed and evaluated these images and lab results as part of my medical decision-making.   EKG Interpretation None      MDM   Final diagnoses:  Paranoid schizophrenia, chronic condition (HCC)  Tobacco use disorder  Involuntary commitment  Poor hygiene  Hallucinations  Delusional thoughts (HCC)  Anemia, unspecified anemia type    45 y.o. female here after mobile crisis took out IVC paperwork. Pt tangential  and unable to give clear history, seems to be responding to hallucinations but denies having hallucinations. Very disheveled. Denies taking medications at this time. Denies medical complaints, denies SI/HI/AVH/EtOH use/drug use. Admits to being a smoker. Will get clearance labs and reassess after. TTS consult started  6:20 PM betaHCG neg, screening labs unremarkable aside from stable baseline anemia on CBC. UDS pending but pt medically cleared at this time. Psych hold orders placed, home meds not restarted since it's  unclear when she last took invega, will default this to psych consult preference. Pt medically cleared, please see TTS notes for further documentation of care/dispo.  BP 118/66 mmHg  Pulse 109  Temp(Src) 98.4 F (36.9 C) (Oral)  Resp 18  SpO2 100%  Meds ordered this encounter  Medications  . alum & mag hydroxide-simeth (MAALOX/MYLANTA) 200-200-20 MG/5ML suspension 30 mL    Sig:   . ondansetron (ZOFRAN) tablet 4 mg    Sig:   . nicotine (NICODERM CQ - dosed in mg/24 hours) patch 21 mg    Sig:   . zolpidem (AMBIEN) tablet 5 mg    Sig:   . ibuprofen (ADVIL,MOTRIN) tablet 600 mg    Sig:   . acetaminophen (TYLENOL) tablet 650 mg    Sig:   . LORazepam (ATIVAN) tablet 1 mg    Sig:        Margareta Laureano Camprubi-Soms, PA-C 05/12/16 1821  Cathren LaineKevin Steinl, MD 05/12/16 [phone]

## 2016-05-13 ENCOUNTER — Inpatient Hospital Stay
Admission: RE | Admit: 2016-05-13 | Discharge: 2016-05-20 | DRG: 885 | Disposition: A | Discharge status: Home / Self Care | Payer: Medicaid Other | Source: Intra-hospital | Attending: Psychiatry | Admitting: Psychiatry

## 2016-05-13 DIAGNOSIS — Z681 Body mass index (BMI) 19 or less, adult: Secondary | ICD-10-CM | POA: Diagnosis not present

## 2016-05-13 DIAGNOSIS — R451 Restlessness and agitation: Secondary | ICD-10-CM | POA: Diagnosis present

## 2016-05-13 DIAGNOSIS — F200 Paranoid schizophrenia: Secondary | ICD-10-CM | POA: Diagnosis present

## 2016-05-13 DIAGNOSIS — Z882 Allergy status to sulfonamides status: Secondary | ICD-10-CM

## 2016-05-13 DIAGNOSIS — G4700 Insomnia, unspecified: Secondary | ICD-10-CM | POA: Diagnosis present

## 2016-05-13 DIAGNOSIS — H5441 Blindness, right eye, normal vision left eye: Secondary | ICD-10-CM | POA: Diagnosis present

## 2016-05-13 DIAGNOSIS — F17210 Nicotine dependence, cigarettes, uncomplicated: Secondary | ICD-10-CM | POA: Diagnosis present

## 2016-05-13 DIAGNOSIS — R627 Adult failure to thrive: Secondary | ICD-10-CM | POA: Diagnosis present

## 2016-05-13 DIAGNOSIS — Z9114 Patient's other noncompliance with medication regimen: Secondary | ICD-10-CM

## 2016-05-13 DIAGNOSIS — F17200 Nicotine dependence, unspecified, uncomplicated: Secondary | ICD-10-CM | POA: Diagnosis present

## 2016-05-13 DIAGNOSIS — Z888 Allergy status to other drugs, medicaments and biological substances status: Secondary | ICD-10-CM | POA: Diagnosis not present

## 2016-05-13 DIAGNOSIS — E46 Unspecified protein-calorie malnutrition: Secondary | ICD-10-CM | POA: Diagnosis present

## 2016-05-13 DIAGNOSIS — Z9119 Patient's noncompliance with other medical treatment and regimen: Secondary | ICD-10-CM | POA: Diagnosis not present

## 2016-05-13 DIAGNOSIS — H548 Legal blindness, as defined in USA: Secondary | ICD-10-CM | POA: Diagnosis present

## 2016-05-13 MED ORDER — MAGNESIUM HYDROXIDE 400 MG/5ML PO SUSP: 30.0000 mL | Freq: Every day | ORAL | Status: DC | PRN

## 2016-05-13 MED ORDER — TRAZODONE HCL 100 MG PO TABS: 100.0000 mg | ORAL_TABLET | Freq: Every day | ORAL | Status: DC

## 2016-05-13 MED ORDER — TRAZODONE HCL 100 MG PO TABS
100.0000 mg | ORAL_TABLET | Freq: Every evening | ORAL | Status: DC | PRN
  Administered 2016-05-13 – 2016-05-19 (×3): 100 mg via ORAL
  Filled 2016-05-13 (×4): qty 1

## 2016-05-13 MED ORDER — ACETAMINOPHEN 325 MG PO TABS
650.0000 mg | ORAL_TABLET | Freq: Four times a day (QID) | ORAL | Status: DC | PRN
Start: 2016-05-13 — End: 2016-05-20

## 2016-05-13 MED ORDER — ALUM & MAG HYDROXIDE-SIMETH 200-200-20 MG/5ML PO SUSP: 30.0000 mL | ORAL | Status: DC | PRN

## 2016-05-13 MED ORDER — PALIPERIDONE ER 6 MG PO TB24: 12.0000 mg | ORAL_TABLET | Freq: Every day | ORAL | Status: DC

## 2016-05-13 MED ORDER — PALIPERIDONE ER 6 MG PO TB24
9.0000 mg | ORAL_TABLET | Freq: Every day | ORAL | Status: DC
  Administered 2016-05-13: 9 mg via ORAL
  Filled 2016-05-13: qty 1

## 2016-05-13 MED ORDER — BENZTROPINE MESYLATE 1 MG PO TABS
1.0000 mg | ORAL_TABLET | Freq: Every day | ORAL | Status: DC
Start: 2016-05-13 — End: 2016-05-13
  Administered 2016-05-13: 1 mg via ORAL
  Filled 2016-05-13: qty 1

## 2016-05-13 MED ORDER — PALIPERIDONE ER 3 MG PO TB24
9.0000 mg | ORAL_TABLET | Freq: Every day | ORAL | Status: DC
  Administered 2016-05-14: 9 mg via ORAL
  Filled 2016-05-13: qty 3

## 2016-05-13 NOTE — ED Notes (Signed)
Pt is alert. Her speech is tangential and not related to questions asked, so she appears to be disorganized. Her behavior has been cooperative. She has tried unsuccessfully tried to call her mother this morning.

## 2016-05-13 NOTE — [facility] Group Notes (Signed)
[facility] Group Notes:  (Nursing/MHT/Case Management/Adjunct)  Date:  05/13/2016  Time:  11:47 PM  Type of Therapy:  Group Therapy  Participation Level:  None  Participation Quality:  Inattentive  Affect:  Flat  Cognitive:  Disorganized  Insight:  Limited  Engagement in Group:  None  Modes of Intervention:  Discussion  Summary of Progress/Problems:  [name] [name] Joy Ruth Tully 05/13/2016, 11:47 PM

## 2016-05-13 NOTE — ED Notes (Signed)
Pt told staff, "Something scary jumped out of my TV: Come in change it."  Her mother called and she was overhead telling her mother that she is friends with Mason JimIvanca Trump, and other delusional comments.

## 2016-05-13 NOTE — Tx Team (Signed)
Initial Interdisciplinary Treatment Plan   PATIENT STRESSORS: Financial difficulties Medication change or noncompliance   PATIENT STRENGTHS: Manufacturing systems engineerCommunication skills Physical Health Supportive family/friends   PROBLEM LIST: Problem List/Patient Goals Date to be addressed Date deferred Reason deferred Estimated date of resolution  "Get back on my medications"      "Get out of here quickly so I can get some coffee"      Psychosis                                           DISCHARGE CRITERIA:  Ability to meet basic life and health needs Reduction of life-threatening or endangering symptoms to within safe limits Verbal commitment to aftercare and medication compliance  PRELIMINARY DISCHARGE PLAN: Return to previous living arrangement  PATIENT/FAMIILY INVOLVEMENT: This treatment plan has been presented to and reviewed with the patient, [name] [name], and/or family member.  The patient and family have been given the opportunity to ask questions and make suggestions.  [name] [name] 05/13/2016, 10:43 PM

## 2016-05-13 NOTE — Consult Note (Signed)
[facility] Consult   Reason for Consult:  Psychosis  Referring Physician:  EDP Patient Identification: [name] [name] MRN:  [id number] Principal Diagnosis: Paranoid schizophrenia, chronic condition ([place]) Diagnosis:   Patient Active Problem List   Diagnosis Date Noted  . Paranoid schizophrenia, chronic condition ([place]) [F20.0] 12/25/2013    Priority: High  . 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 Tobacco use disorder [F17.200] 04/27/2016    Total Time spent with patient: 45 minutes  Subjective:   [name] [name] is a 45 y.o. female patient admitted with psychosis.  HPI:  On admission:  45 y.o. female who presented under IVC (petition filed by a mobile crisis team); she presented to [facility] with significant delusion and in an apparent state of self-neglect. Pt was recently released from inpatient treatment at [facility] where she was treated for schizophrenic symptoms (delusion, hallucination). Pt was oriented to person and time, but not to situation or place. She believed she was in a jail and that Pryor Curia was a Engineer, structural. Pt was very poor historian and assessment was difficult. Pt's speech was very tangential and non-responsive to questions asked. Pt expressed belief that she was being poisoned with bones, that human bones were being slipped into coffee, that "they" wanted to abuse bones when "we died." Pt spoke of knowing [employer], taking money from [employer], and not accepting "[employer]" money. Pt's speech content moved quickly from concerns about the government to where she lived ("I live in a trailer, so I know I won't die"). Pt indicated that she lives with her mother, and that she does not understand why she is at the hospital ("I'm going to jail ... Take me to jail... They don't make you take off your clothes").   During assessment, Pt rocked back and forth. She appeared disheveled and seemed to have bad hygiene. She had  good eye contact and demeanor was pleasant and talkative. Pt denied suicidal or homicidal ideation, and she denied self-injury. Pt denied auditory/visual hallucination, but it was apparent to author that Pt was responding to internal stimuli before the assessment began. Pt denied depressive symptoms. However, per report, Pt is not eating or sleeping, and she is neglecting basic self-care. Pt's thought processes were incoherent and illogical; thought content indicated the presence of somatic and bizarre delusion. There was no evidence of substance use. Pt's concentration was poor; memory was also deemed poor. Speech was tangential. Judgment and insight were deemed poor; impulse control could not be determined.  Today:  Patient remains confused to why she is here, disheveled and responding to internal stimuli.  Past Psychiatric History: schizophrenia  Risk to Self: Suicidal Ideation: No Suicidal Intent: No Is patient at risk for suicide?: No Suicidal Plan?: No Access to Means: No What has been your use of drugs/alcohol within the last 12 months?: Cigarettes How many times?: 2 (Per previous report) Triggers for Past Attempts: Unknown Intentional Self Injurious Behavior: None Risk to Others: Homicidal Ideation: No Thoughts of Harm to Others: No Current Homicidal Intent: No Current Homicidal Plan: No Access to Homicidal Means: No History of harm to others?: No Assessment of Violence: None Noted Does patient have access to weapons?: No Criminal Charges Pending?: No Does patient have a court date: No Prior Inpatient Therapy: Prior Inpatient Therapy: Yes Prior Therapy Dates: 2015, 2017 Prior Therapy Facilty/Provider(s): [facility] [place] [place] Reason for Treatment: Schizophrenia Prior Outpatient Therapy: Prior Outpatient Therapy: No Prior Therapy Dates: N/A Prior Therapy Facilty/Provider(s): N/A Reason  for Treatment: N/A Does patient have an ACCT team?: Unknown Does patient have Intensive  In-House Services?  : Unknown Does patient have [facility] services? : Unknown Does patient have P4CC services?: Unknown  Past Medical History:  Past Medical History  Diagnosis Date  . Schizophrenia ([place])     only known    History reviewed. No pertinent past surgical history. Family History: No family history on file. Family Psychiatric  History: none Social History:  History  Alcohol Use  . 0.0 oz/week  . 1-2 Cans of beer per week    Comment: unknown - 04/22/16     History  Drug Use Not on file    Social History   Social History  . Marital Status: Single    Spouse Name: N/A  . Number of Children: N/A  . Years of Education: N/A   Social History Main Topics  . Smoking status: Current Every Day Smoker -- 1.00 packs/day    Types: Cigarettes  . Smokeless tobacco: None  . Alcohol Use: 0.0 oz/week    1-2 Cans of beer per week     Comment: unknown - 04/22/16  . Drug Use: None  . Sexual Activity: No   Other Topics Concern  . None   Social History Narrative   Additional Social History:    Allergies:   Allergies  Allergen Reactions  . Abilify [Aripiprazole]     Aggressive behavior    . Sulfa Antibiotics Other (See Comments)    unknown    Labs:  Results for orders placed or performed during the hospital encounter of 05/12/16 (from the past 48 hour(s))  Comprehensive metabolic panel     Status: Abnormal   Collection Time: 05/12/16  5:24 PM  Result Value Ref Range   Sodium 136 135 - 145 mmol/L   Potassium 3.9 3.5 - 5.1 mmol/L   Chloride 106 101 - 111 mmol/L   CO2 24 22 - 32 mmol/L   Glucose, Bld 96 65 - 99 mg/dL   BUN 15 6 - 20 mg/dL   Creatinine, Ser 0.76 0.44 - 1.00 mg/dL   Calcium 8.6 (L) 8.9 - 10.3 mg/dL   Total Protein 7.2 6.5 - 8.1 g/dL   Albumin 4.0 3.5 - 5.0 g/dL   AST 30 15 - 41 U/L   ALT 54 14 - 54 U/L   Alkaline Phosphatase 45 38 - 126 U/L   Total Bilirubin 0.7 0.3 - 1.2 mg/dL   GFR calc non Af Amer >60 >60 mL/min   GFR calc Af Amer >60 >60  mL/min    Comment: (NOTE) The eGFR has been calculated using the CKD EPI equation. This calculation has not been validated in all clinical situations. eGFR's persistently <60 mL/min signify possible Chronic Kidney Disease.    Anion gap 6 5 - 15  Ethanol     Status: None   Collection Time: 05/12/16  5:24 PM  Result Value Ref Range   Alcohol, Ethyl (B) <5 <5 mg/dL    Comment:        LOWEST DETECTABLE LIMIT FOR SERUM ALCOHOL IS 5 mg/dL FOR MEDICAL PURPOSES ONLY   Salicylate level     Status: None   Collection Time: 05/12/16  5:24 PM  Result Value Ref Range   Salicylate Lvl <9.5 2.8 - 30.0 mg/dL  Acetaminophen level     Status: Abnormal   Collection Time: 05/12/16  5:24 PM  Result Value Ref Range   Acetaminophen (Tylenol), Serum <10 (L) 10 - 30 ug/mL  Comment:        THERAPEUTIC CONCENTRATIONS VARY SIGNIFICANTLY. A RANGE OF 10-30 ug/mL MAY BE AN EFFECTIVE CONCENTRATION FOR MANY PATIENTS. HOWEVER, SOME ARE BEST TREATED AT CONCENTRATIONS OUTSIDE THIS RANGE. ACETAMINOPHEN CONCENTRATIONS >150 ug/mL AT 4 HOURS AFTER INGESTION AND >50 ug/mL AT 12 HOURS AFTER INGESTION ARE OFTEN ASSOCIATED WITH TOXIC REACTIONS.   cbc     Status: Abnormal   Collection Time: 05/12/16  5:24 PM  Result Value Ref Range   WBC 7.3 4.0 - 10.5 K/uL   RBC 3.77 (L) 3.87 - 5.11 MIL/uL   Hemoglobin 11.6 (L) 12.0 - 15.0 g/dL   HCT 35.1 (L) 36.0 - 46.0 %   MCV 93.1 78.0 - 100.0 fL   MCH 30.8 26.0 - 34.0 pg   MCHC 33.0 30.0 - 36.0 g/dL   RDW 14.5 11.5 - 15.5 %   Platelets 258 150 - 400 K/uL  I-Stat beta hCG blood, ED     Status: None   Collection Time: 05/12/16  5:34 PM  Result Value Ref Range   I-stat hCG, quantitative <5.0 <5 mIU/mL   Comment 3            Comment:   GEST. AGE      CONC.  (mIU/mL)   <=1 WEEK        5 - 50     2 WEEKS       50 - 500     3 WEEKS       100 - 10,000     4 WEEKS     1,000 - 30,000        FEMALE AND NON-PREGNANT FEMALE:     LESS THAN 5 mIU/mL   Rapid urine drug  screen (hospital performed)     Status: None   Collection Time: 05/12/16  6:02 PM  Result Value Ref Range   Opiates NONE DETECTED NONE DETECTED   Cocaine NONE DETECTED NONE DETECTED   Benzodiazepines NONE DETECTED NONE DETECTED   Amphetamines NONE DETECTED NONE DETECTED   Tetrahydrocannabinol NONE DETECTED NONE DETECTED   Barbiturates NONE DETECTED NONE DETECTED    Comment:        DRUG SCREEN FOR MEDICAL PURPOSES ONLY.  IF CONFIRMATION IS NEEDED FOR ANY PURPOSE, NOTIFY LAB WITHIN 5 DAYS.        LOWEST DETECTABLE LIMITS FOR URINE DRUG SCREEN Drug Class       Cutoff (ng/mL) Amphetamine      1000 Barbiturate      200 Benzodiazepine   625 Tricyclics       638 Opiates          300 Cocaine          300 THC              50     Current Facility-Administered Medications  Medication Dose Route Frequency Provider Last Rate Last Dose  . acetaminophen (TYLENOL) tablet 650 mg  650 mg Oral Q4H PRN Mercedes Camprubi-Soms, PA-C      . alum & mag hydroxide-simeth (MAALOX/MYLANTA) 200-200-20 MG/5ML suspension 30 mL  30 mL Oral PRN Mercedes Camprubi-Soms, PA-C      . benztropine (COGENTIN) tablet 1 mg  1 mg Oral Daily Promyse Ardito, MD      . ibuprofen (ADVIL,MOTRIN) tablet 600 mg  600 mg Oral Q8H PRN Mercedes Camprubi-Soms, PA-C      . LORazepam (ATIVAN) tablet 1 mg  1 mg Oral Q8H PRN Mercedes Camprubi-Soms, PA-C      .  nicotine (NICODERM CQ - dosed in mg/24 hours) patch 21 mg  21 mg Transdermal Daily Mercedes Camprubi-Soms, PA-C      . ondansetron (ZOFRAN) tablet 4 mg  4 mg Oral Q8H PRN Mercedes Camprubi-Soms, PA-C      . paliperidone (INVEGA) 24 hr tablet 9 mg  9 mg Oral Daily Abbrielle Batts, MD      . traZODone (DESYREL) tablet 100 mg  100 mg Oral QHS Corena Pilgrim, MD       Current Outpatient Prescriptions  Medication Sig Dispense Refill  . [START ON 05/28/2016] paliperidone (INVEGA SUSTENNA) 234 MG/1.5ML SUSP injection Inject 234 mg into the muscle once. (Patient not taking: Reported  on 05/12/2016) 0.9 mL 0  . paliperidone (INVEGA) 6 MG 24 hr tablet Take 2 tablets (12 mg total) by mouth daily. (Patient not taking: Reported on 05/12/2016) 60 tablet 0    Musculoskeletal: Strength & Muscle Tone: within normal limits Gait & Station: normal Patient leans: N/A  Psychiatric Specialty Exam: Physical Exam  Constitutional: She is oriented to person, place, and time. She appears well-developed and well-nourished.  HENT:  Head: Normocephalic.  Neck: Normal range of motion.  Respiratory: Effort normal.  GI: Soft.  Musculoskeletal: Normal range of motion.  Neurological: She is alert and oriented to person, place, and time.  Skin: Skin is warm and dry.  Psychiatric: Her speech is normal. She is actively hallucinating. Thought content is paranoid and delusional. Cognition and memory are normal. She expresses inappropriate judgment. She exhibits a depressed mood.    Review of Systems  Constitutional: Negative.   HENT: Negative.   Eyes: Negative.   Respiratory: Negative.   Cardiovascular: Negative.   Gastrointestinal: Negative.   Genitourinary: Negative.   Musculoskeletal: Negative.   Skin: Negative.   Neurological: Negative.   Endo/Heme/Allergies: Negative.   Psychiatric/Behavioral: Positive for hallucinations. The patient is nervous/anxious.     Blood pressure 97/54, pulse 69, temperature 98.2 F (36.8 C), temperature source Oral, resp. rate 18, SpO2 98 %.There is no weight on file to calculate BMI.  General Appearance: Disheveled  Eye Contact:  Fair  Speech:  Normal Rate  Volume:  Normal  Mood:  Anxious  Affect:  Flat  Thought Process:  Descriptions of Associations: Tangential  Orientation:  Full (Time, Place, and Person)  Thought Content:  Hallucinations: Auditory Visual  Suicidal Thoughts:  No  Homicidal Thoughts:  No  Memory:  Immediate;   Fair Recent;   Poor Remote;   Fair  Judgement:  Impaired  Insight:  Lacking  Psychomotor Activity:  Decreased   Concentration:  Concentration: Fair and Attention Span: Fair  Recall:  [employer] of Knowledge:  Fair  Language:  Good  Akathisia:  No  Handed:  Right  AIMS (if indicated):     Assets:  Housing Leisure Time Physical Health Resilience Social Support  ADL's:  Intact  Cognition:  Impaired,  Moderate  Sleep:        Treatment Plan Summary: Daily contact with patient to assess and evaluate symptoms and progress in treatment, Medication management and Plan schizophrenia, paranoid:  -Crisis stabilization -Medication management:  Change Invega from 6 mg to 9 mg daily for psychosis.  Start Cogentin 1 mg daily for EPS, Ativan 1 mg every 8 hours PRN anxiety, and Trazodone 100 mg daily for sleep. -Individual counseling  Disposition: Recommend psychiatric Inpatient admission when medically cleared.  Waylan Boga, NP 05/13/2016 11:39 AM Patient seen face-to-face for psychiatric evaluation, chart reviewed and case discussed with the  physician extender and developed treatment plan. Reviewed the information documented and agree with the treatment plan. Corena Pilgrim, MD

## 2016-05-13 NOTE — ED Notes (Signed)
Pt transferred to [facility] for further treatment of her care. All belongings returned to pt who signed for same.

## 2016-05-13 NOTE — BH Assessment (Signed)
Patient has been accepted [facility].  Accepting physician is Dr. Lucianne MussKumar.  Attending Physician will be Dr. Ardyth HarpsHernandez.  Patient has [facility] to room 325, [facility] Nurse Edwena BundeJanet J.  Call report to [phone].  Representative/Transfer Coordinator is Rheanna Sergent.  [facility] ER Staff Maisie Fus(Thomas, TTS) made aware of acceptance.

## 2016-05-13 NOTE — Progress Notes (Addendum)
Patient alert and oriented to person and place with periods of confusion to time and situation. Upon arrival to the unit skin assessment was done with Ceasar MonsMargaret RN; it was noted warm to touch, dry and intact no redness or scar noted. No contraband was found. Patient was oriented to the unit, was offered a sandwich tray and a drink, both accepted, no distress noted, 15 minutes checks maintained will continue to closely monitor.

## 2016-05-13 NOTE — BH Assessment (Signed)
[facility] Assessment Progress Note  Per Thedore MinsMojeed Akintayo, MD, this pt requires psychiatric hospitalization at this time. Robinette Hainesalvin Manning at [facility] reports that pt has been accepted to their facility by Nelly RoutArchana Kumar, MD to the service of Dr Ardyth HarpsHernandez, Rm 325. Pt presents under IVC, upheld by Dr Jannifer FranklinAkintayo, and IVC documents have been faxed to [phone]. [facility] Pt's nurse has been notified, and agrees to call report to [phone].  Doylene Canninghomas Ivone Licht, MA Triage Specialist [phone]

## 2016-05-14 ENCOUNTER — Encounter: Payer: Self-pay | Admitting: Psychiatry

## 2016-05-14 DIAGNOSIS — F200 Paranoid schizophrenia: Principal | ICD-10-CM

## 2016-05-14 MED ORDER — LORAZEPAM 1 MG PO TABS
1.0000 mg | ORAL_TABLET | Freq: Every day | ORAL | Status: DC
  Administered 2016-05-14 – 2016-05-19 (×6): 1 mg via ORAL
  Filled 2016-05-14 (×6): qty 1

## 2016-05-14 MED ORDER — PALIPERIDONE ER 3 MG PO TB24
12.0000 mg | ORAL_TABLET | Freq: Every day | ORAL | Status: DC
  Administered 2016-05-15 – 2016-05-20 (×6): 12 mg via ORAL
  Filled 2016-05-14 (×6): qty 4

## 2016-05-14 MED ORDER — ENSURE ENLIVE PO LIQD
237.0000 mL | Freq: Three times a day (TID) | ORAL | Status: DC
  Administered 2016-05-14 – 2016-05-20 (×18): 237 mL via ORAL

## 2016-05-14 MED ORDER — ADULT MULTIVITAMIN W/MINERALS CH
1.0000 | ORAL_TABLET | Freq: Every day | ORAL | Status: DC
  Administered 2016-05-14 – 2016-05-20 (×7): 1 via ORAL
  Filled 2016-05-14 (×7): qty 1

## 2016-05-14 MED ORDER — NICOTINE 21 MG/24HR TD PT24
21.0000 mg | MEDICATED_PATCH | Freq: Every day | TRANSDERMAL | Status: DC
  Administered 2016-05-15: 21 mg via TRANSDERMAL
  Filled 2016-05-14 (×4): qty 1

## 2016-05-14 NOTE — Progress Notes (Signed)
Parasitic insects were noticed crawling in Pt's hair-type of insects are still yet to be determined. Pt at this time is in a private room.

## 2016-05-14 NOTE — [facility] Suicide Risk Assessment (Signed)
[facility]   Nursing information obtained from:  Patient Demographic factors:  Caucasian, Low socioeconomic status, Unemployed Current Mental Status:  NA Loss Factors:  NA Historical Factors:  NA Risk Reduction Factors:  Living with another person, especially a relative, Positive social support  Total Time spent with patient: 1 hour Principal Problem: Schizophrenia, paranoid, chronic ([facility]) Diagnosis:   Patient Active Problem List   Diagnosis Date Noted  . Schizophrenia, paranoid, chronic ([facility]) [F20.0] 05/13/2016  . 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 Tobacco use disorder [F17.200] 04/27/2016   Subjective Data:   Continued Clinical Symptoms:  Alcohol Use Disorder Identification Test Final Score (AUDIT): 0 The "Alcohol Use Disorders Identification Test", Guidelines for Use in Primary Care, Second Edition.  World Science writerHealth Organization [facility](WHO). Score between 0-7:  no or low risk or alcohol related problems. Score between 8-15:  moderate risk of alcohol related problems. Score between 16-19:  high risk of alcohol related problems. Score 20 or above:  warrants further diagnostic evaluation for alcohol dependence and treatment.   CLINICAL FACTORS:   Schizophrenia:   Paranoid or undifferentiated type Currently Psychotic Previous Psychiatric Diagnoses and Treatments   Psychiatric Specialty Exam: Physical Exam  ROS  Blood pressure 102/63, pulse 83, temperature 99.3 F (37.4 C), temperature source Oral, resp. rate 18, height 5' 7.72" (1.72 m), weight 51.256 kg (113 lb), last menstrual period 04/27/2016.Body mass index is 17.33 kg/(m^2).    COGNITIVE FEATURES THAT CONTRIBUTE TO RISK:  None    SUICIDE RISK:   Minimal: No identifiable suicidal ideation.  Patients presenting with no risk factors but with morbid ruminations; may be classified as minimal risk based on the severity of the depressive symptoms  [facility] C[facility]  I certify that inpatient services furnished can reasonably be expected to improve the patient's condition.   Jimmy FootmanHernandez-Gonzalez,  Davidson Palmieri, MD 05/14/2016, 2:34 PM

## 2016-05-14 NOTE — BHH Group Notes (Signed)
[facility] LCSW Group Therapy  05/14/2016 5:12 PM  Type of Therapy:  Group Therapy  Participation Level:  Did Not Attend  Modes of Intervention:  Discussion, Education, Socialization and Support  Summary of Progress/Problems:Feelings around Relapse. Group members discussed the meaning of relapse and shared personal stories of relapse, how it affected them and others, and how they perceived themselves during this time. Group members were encouraged to identify triggers, warning signs and coping skills used when facing the possibility of relapse. Social supports were discussed and explored in detail.   Amaura Authier L Katey Barrie MSW, LCSWA  05/14/2016, 5:12 PM   
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
## 2016-05-14 NOTE — Progress Notes (Signed)
Recreation Therapy Notes  Date: 06.16.17 Time: 9:30 am Location: Craft Room  Group Topic: Coping Skills  Goal Area(s) Addresses:  Patient will participate in healthy coping skill. Patient will verbalize using art as a coping skill.  Behavioral Response: Attentive  Intervention: Coloring  Activity: Patients were given coloring sheets to color and were instructed to think about what their mind was focus on and what emotions they felt.  Education:LRT educated patients on healthy coping skills.  Education Outcome: In group clarification offered  Clinical Observations/Feedback: Patient colored coloring sheet. Patient did not contribute to group discussion.  Jacquelynn CreeGreene,Winifred Balogh M, LRT/CTRS 05/14/2016 10:33 AM

## 2016-05-14 NOTE — Tx Team (Signed)
Interdisciplinary Treatment Plan Update (Adult)        Date: 05/14/2016 NEW ADMISSION (Pt was previously discharged on June 9th, 2017)  Time Reviewed: 9:30 AM   Progress in Treatment: Improving  Attending groups: Continuing to assess, patient new to milieu  Participating in groups: Continuing to assess, patient new to milieu  Taking medication as prescribed: Yes  Tolerating medication: Yes  Family/Significant other contact made: No, CSW assessing for appropriate contacts  Patient understands diagnosis: Yes  Discussing patient identified problems/goals with staff: Yes  Medical problems stabilized or resolved: Yes  Denies suicidal/homicidal ideation: Yes  Issues/concerns per patient self-inventory: Yes  Other:   New problem(s) identified: N/A   Discharge Plan or Barriers: Pt will discharge home to [facility] to live with her mother and will follow up with [facility] for medication management and therapy.  Reason for Continuation of Hospitalization:   Depression   Anxiety   Medication Stabilization   Comments: N/A   Estimated length of stay: 5-7 days    Patient is a 45 year old female admitted after presenting to the [facility] ED after being transported by the [facility]. Patient lives in [place]. Per previous admission H&P:  Pt is a65 y.o. female presenting to [facility] voluntarily via EMS with her mother who reports "she isn't feeling good and she is sick" "her teeth is rotting out of her head, she needs medication to realize that she needs healthcare." Patients mother reports that the patient was previously prescribed Haldol and Cogentin and she feels that the patient should be forced to be placed back on her medications "so that she can realize that I need o take care of her, she won't go to the doctor or take her medications." Patients mother denies that the patient has reported SI or HI but reports "she argues with the voices in her head and she laughs at  them sometimes." Patients mother reports that the patient has not taken her medication since 2014 and has a history of not taking her medications unless she is in the hospital. Patients mother reports that the patient has been complaining of stomach pain and she called EMS because she refuses to go to a doctor. Per chart review patient endorsed SI to EMS.   05/14/16: CSW at last admission filed a report, which was accepted, with APS.  CSW recommended to APS that guardianship be pursued with DSS as pt's legal guardian.  CSW also referred pt to [facility].  Pt is a client of [facility] presently.  Shortly, thereafter [facility] visited the pt and Sheriff's Dept was said to appear shortly thereafter.  Details are unclear at this time.  Per previous notes: Patient was assessed alone and her speech was very limited and circumstantial. Patient repeated "it's the government, I'm homeless, the government said I was homeless and that I need to see a sheriff." Patient then talks about the stock market and then repeats "it's the government." Patient is sitting upright in the main ED and rocks back and forth and points her right index finger into her left hand while staring at her index finger. Patient does not make eye contact. Patient denies SI and states that she did not tell EMS that she was suicidal. Patient denies HI. Patient did not answer the remaining questions of the assessment and continued to repeat statements about "the government" and "the stock market." Patients mother was invited back into the room and confirms that the patient  has not been suicidal or homicidal to her knowledge. Patients mother's main concern is that patient is unable to care for herself at this time. Patients mother states that she is not the patients guardian.  Patient will benefit from crisis stabilization, medication evaluation, group therapy, and psycho education in addition to case management for discharge  planning. Patient and CSW reviewed pt's identified goals and treatment plan. Pt verbalized understanding and agreed to treatment plan.    Review of initial/current patient goals per problem list:  1. Goal(s): Patient will participate in aftercare plan   Met: Yes  Target date: 3-5 days post admission date   As evidenced by: Patient will participate within aftercare plan AEB aftercare provider and housing plan at discharge being identified.   6/16: Pt will discharge home to [facility] to live with her mother and will follow up with [facility] for medication management and therapy.    2. Goal (s): Patient will exhibit decreased depressive symptoms and suicidal ideations.   Met: No  Target date: 3-5 days post admission date   As evidenced by: Patient will utilize self-rating of depression at 3 or below and demonstrate decreased signs of depression or be deemed stable for discharge by MD.   6/16: Goal progressing.    3. Goal(s): Patient will demonstrate decreased signs and symptoms of anxiety.   Met: No  Target date: 3-5 days post admission date   As evidenced by: Patient will utilize self-rating of anxiety at 3 or below and demonstrated decreased signs of anxiety, or be deemed stable for discharge by MD   6/16: Goal progressing    4. Goal(s): Patient will demonstrate decreased signs of psychosis  * Met: No * Target date: 3-5 days post admission date  * As evidenced by: Patient will demonstrate decreased frequency of AVH or return to baseline function   6/16: Goal progressing.    Attendees:  Patient: [name] [name] Family:  Physician: Dr. Jerilee Hoh, MD    05/14/2016 9:30 AM  Nursing: Elige Radon, RN     05/14/2016 9:30 AM  Clinical Social Worker: Marylou Flesher, [place]  05/14/2016 9:30 AM  Recreational Therapist: Everitt Amber: , LRT  05/14/2016 9:30 AM   Clinical Social Worker: Glorious Peach, [place]  05/14/2016 9:30 AM  Other:        05/14/2016 9:30 AM  Other:         05/14/2016 9:30 AM    Alphonse Guild. Jacque Byron, LCSWA, LCAS  05/14/16

## 2016-05-14 NOTE — Progress Notes (Signed)
Admission Note  Pt is a 45 y/o Caucasian female [facility]. Pt at the time of admission denied any form of depression, anxiety, pain, SI, HI or AVH; state, "I don't know why I'm here; there is nothing wrong with me." Pt is however was very flat and made minimal interaction-mostly "YES" or "NO" answers. Support, encouragement, and safe environment provided.  15-minute safety checks was initiated and continued. Pt remained calm and cooperative through the admission process.

## 2016-05-14 NOTE — Progress Notes (Signed)
During am report it was reported that bugs was noted walking in patient head and that infection control needed to be notified.  Contacted infection control and they reported to try to capture bug and inform doctor and see what they wanted done.  Patient head inspected.  No bugs noted.  Not nits or lice noted.  Only dandruff because all white flakes were easily flaked off.  Patient assisted with shower and hair washed.  Support and encouragement offered.  Safety maintained.

## 2016-05-14 NOTE — [facility] Group Notes (Signed)
[facility] Group Notes:  (Nursing/MHT/Case Management/Adjunct)  Date:  05/14/2016  Time:  5:41 PM  Type of Therapy:  Psychoeducational Skills  Participation Level:  Did Not Attend  Twanna Hymanda C Reiley Keisler 05/14/2016, 5:41 PM

## 2016-05-15 NOTE — [facility] Group Notes (Signed)
[facility] Group Notes:  (Nursing/MHT/Case Management/Adjunct)  Date:  05/15/2016  Time:  10:40 PM  Type of Therapy:  Psychoeducational Skills  Participation Level:  Did Not Attend  Summary of Progress/Problems:  Chancy MilroyLaquanda Y Jovoni Borkenhagen 05/15/2016, 10:40 PM

## 2016-05-15 NOTE — Progress Notes (Addendum)
Pt has been pleasant and cooperative but confused and disorganized. Pt denies SI and A/V hallucinations.Pt stated she was in the hospital because  She  " just needed a prescription". Pt's mood and affect has been depressed. Pt attended some unit activities, some irritability noted. Pt has been seclusive to her room. Will continue to observe and maintain a safe environment.

## 2016-05-15 NOTE — Progress Notes (Signed)
Pt affect flat. Eye contact,  staring. Pacing unit. Did not attend evening group.  Did not participate in assessment other than to deny SI/AVH.  Myriam JacobsonHelen (pts sister) called and requested to have SW Christiane HaJonathan to contact her on Monday. Safety maintained. Medication compliant. Will continue to monitor.

## 2016-05-15 NOTE — [facility] Group Notes (Signed)
[facility] Group Notes:  (Nursing/MHT/Case Management/Adjunct)  Date:  05/15/2016  Time:  1:48 AM  Type of Therapy:  Psychoeducational Skills  Sora Olivo R Eugenia Eldredge 05/15/2016, 1:48 AM

## 2016-05-15 NOTE — [facility] Group Notes (Signed)
[facility] LCSW Group Therapy  05/15/2016 2:21 PM  Type of Therapy:  Group Therapy  Participation Level:  Did Not Attend  Modes of Intervention:  Activity, Discussion, Education, Socialization and Support  Summary of Progress/Problems: Pt will identify unhealthy thoughts and how they impact their emotions and behavior. Pt will be encouraged to discuss these thoughts, emotions and behaviors with the group.    Fidela Cieslak L Adalid Beckmann MSW, LCSWA  05/15/2016, 2:21 PM  
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
## 2016-05-16 MED ORDER — LORAZEPAM 1 MG PO TABS: 1.0000 mg | ORAL_TABLET | Freq: Every evening | ORAL | Status: DC | PRN

## 2016-05-16 MED ORDER — PALIPERIDONE ER 6 MG PO TB24: 12.0000 mg | ORAL_TABLET | Freq: Every day | ORAL | Status: DC

## 2016-05-16 MED ORDER — PALIPERIDONE PALMITATE 234 MG/1.5ML IM SUSP: 234.0000 mg | Freq: Once | INTRAMUSCULAR | Status: DC

## 2016-05-16 NOTE — [facility] Group Notes (Signed)
[facility] LCSW Group Therapy  05/16/2016 2:18 PM  Type of Therapy:  Group Therapy  Participation Level:  Did Not Attend  Modes of Intervention:  Discussion, Education, Socialization and Support  Summary of Progress/Problems: Self esteem: Patients discussed self esteem and how it impacts them. They discussed what aspects in their lives has influenced their self esteem. They were challenged to identify changes that are needed in order to improve self esteem.    Deng Kemler L Wesson Stith MSW, LCSWA  05/16/2016, 2:18 PM  

## 2016-05-16 NOTE — Progress Notes (Signed)
Pt assessed in room. She was sitting in edge of bed in the dark staring off into space. Does not initiate conversation. Remains disorganized. Only answered yes or no questions. Compliant with evening medications. Appearance disheveled. Denies SI/AVH although she appears to be responding to internal stimuli. Denies pain. Voices no additional concerns. Remains safe on unit.

## 2016-05-16 NOTE — Progress Notes (Signed)
Pt has been pleasant and cooperative but confused and disorganized. Pt denies SI and A/V hallucinations.Pt stated she was in the hospital because She " just needed a prescription". Pt's mood and affect has been depressed. Some looseness of associations noted. Pt appears to be responding to internal stimuli at times.  Pt has attended some of the unit activities, some irritability noted at times. Pt has been  Less seclusive to her room. Will continue to observe and maintain a safe environment
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
## 2016-05-16 NOTE — Progress Notes (Addendum)
Pt remains disorganized.  Denies SI/AVH. Did not attend evening group. Compliant with medications. Tangential. Remains safe on unit. Denies any pain and voices no concerns at this time.

## 2016-05-17 MED ORDER — FLUVOXAMINE MALEATE 50 MG PO TABS
50.0000 mg | ORAL_TABLET | Freq: Every day | ORAL | Status: DC
  Administered 2016-05-17: 50 mg via ORAL
  Filled 2016-05-17: qty 1

## 2016-05-17 MED ORDER — VITAMIN B-12 1000 MCG PO TABS
1000.0000 ug | ORAL_TABLET | Freq: Every day | ORAL | Status: DC
  Administered 2016-05-20: 1000 ug via ORAL
  Filled 2016-05-17: qty 1

## 2016-05-17 MED ORDER — CYANOCOBALAMIN 1000 MCG/ML IJ SOLN
1000.0000 ug | Freq: Every day | INTRAMUSCULAR | Status: AC
  Administered 2016-05-17 – 2016-05-19 (×3): 1000 ug via INTRAMUSCULAR
  Filled 2016-05-17 (×3): qty 1

## 2016-05-17 MED ORDER — MEGESTROL ACETATE 400 MG/10ML PO SUSP
400.0000 mg | Freq: Every day | ORAL | Status: DC
  Administered 2016-05-18 – 2016-05-20 (×3): 400 mg via ORAL
  Filled 2016-05-17 (×4): qty 10

## 2016-05-17 NOTE — Progress Notes (Signed)
Patient Oriented to person only.  Denies SI/HI/AVH.  Verbalizes that she is doing fine.  Paces the halls and talks to herself.  Noted coming down hall talking to herself.  Asked what she was talking about patient states "You can't get any money if you are a republican"  Attends group and only stays a few minutes and leaves.  Poor hygiene.  Collecting newspapers in her room. Support and encouragement offered, safety maintained.

## 2016-05-17 NOTE — [facility] Group Notes (Signed)
[facility] Group Notes:  (Nursing/MHT/Case Management/Adjunct)  Date:  05/17/2016  Time:  5:53 PM  Type of Therapy:  Psychoeducational Skills  Participation Level:  Did Not Attend   [name] 05/17/2016, 5:53 PM

## 2016-05-17 NOTE — Progress Notes (Signed)
Recreation Therapy Notes  Date: 06.19.17 Time: 9:30 am Location: Craft Room  Group Topic: Self-expression  Goal Area(s) Addresses:  Patient will identify one color per emotion listed on wheel. Patient will verbalize one emotion experienced during session. Patient will be educated on other forms of self-expression.  Behavioral Response: Did not attend  Intervention: Emotion Wheel  Activity: Patients were given a worksheet with seven emotions on it and were instructed to pick a color for each emotion.  Education: LRT educated patients on other forms of self-expression.  Education Outcome: Patient did not attend group.  Clinical Observations/Feedback: Patient did not attend group.  Stefani Baik M, LRT/CTRS 05/17/2016 10:14 AM 
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
## 2016-05-18 MED ORDER — FLUVOXAMINE MALEATE 50 MG PO TABS
100.0000 mg | ORAL_TABLET | Freq: Every day | ORAL | Status: DC
  Administered 2016-05-18 – 2016-05-19 (×2): 100 mg via ORAL
  Filled 2016-05-18 (×3): qty 2

## 2016-05-18 NOTE — Plan of Care (Signed)
Problem: Safety: Goal: Periods of time without injury will increase Outcome: Progressing Patient has been without injury since admission.    

## 2016-05-18 NOTE — Plan of Care (Signed)
Problem: Health Behavior/Discharge Planning: Goal: Compliance with prescribed medication regimen will improve Outcome: Progressing Patient has been compliant with medication since admission.
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
## 2016-05-18 NOTE — Progress Notes (Signed)
D: Patient seems somewhat disorganized but still bizarre. Has magazines and papers laying all around room. Patient gets very tangential when talking about what's in the magazines. Denies SI/HI/AVH. She has been visible in the milieu. Denies pain.  A: Medication given with education. Encouragement provided.  R: Patient was compliant with medication. She has remained calm and cooperative. Safety maintained with 15 min checks

## 2016-05-18 NOTE — Progress Notes (Signed)
D: Patient seems somewhat disorganized but still bizarre. Denies SI/HI/AVH. Isolated to room this evening but came out for snack. Denies pain.  A: Medication given with education. Encouragement provided.  R: Patient was compliant with medication. She has remained calm and cooperative. Safety maintained with 15 min checks.

## 2016-05-18 NOTE — BHH Group Notes (Signed)
[facility] Group Notes:  (Nursing/MHT/Case Management/Adjunct)  Date:  05/18/2016  Time:  6:02 PM  Type of Therapy:  Psychoeducational Skills  Participation Level:  Did Not Attend   Lynelle SmokeCara Travis [facility] 05/18/2016, 6:02 PM

## 2016-05-18 NOTE — Progress Notes (Signed)
Pt alert and oriented to self only. Bizarre and disorganized. Pt noted in room with torn pages out of magazines positioned all over bed, window seal, floor and chair. Pt is med compliant and paces hallway at times. Noted talking and smiling at tv. Pleasant and cooperative. Encouragement and support offered. Pt only acknowledges your presence and answers yes or no questions. Pt responding. Pt remains safe on unit with q 15 min checks.

## 2016-05-18 NOTE — [facility] Group Notes (Signed)
[facility]  05/18/2016 11:15 AM  Participation Quality:  Came to say hi to group from hallway then left  Affect:    Cognitive:  Disorganized  Insight:  Lacking  Engagement in Group:  Lacking  Modes of Intervention:    Summary of Progress/Problems: Patient stayed in hallway but came to say hi to everyone and left.  [name] MoloneyBandi, [name] [name] M 05/18/2016, 11:15 AM

## 2016-05-18 NOTE — Progress Notes (Signed)
Recreation Therapy Notes  Date: 6.20.17 Time: 9:30 am Location: Craft Room  Group Topic: Goal Setting  Goal Area(s) Addresses:  Patient will identify one goal. Patient will identify at least one supportive statement. Patient will verbalize benefit of setting goals.  Behavioral Response: Inattentive, Left early  Intervention: Step By Step  Activity: Patients were given a foot worksheet and instructed to write their goal inside the food. On the outside of the foot, patients were instructed to write supportive statements.  Education: LRT educated patients on healthy ways they can celebrate reaching their goals.   Education Outcome: In group clarification offered  Clinical Observations/Feedback: Patient wrote on worksheet. Patient would stand and smile at papers she brought to group. Patient did not contribute to group discussion. Patient left group at approximately 10:00 am. Patient did not return to group.  Jacquelynn CreeGreene,Jesselyn Rask M, LRT/CTRS 05/18/2016 10:14 AM

## 2016-05-19 MED ORDER — PROPRANOLOL HCL ER 60 MG PO CP24
60.0000 mg | ORAL_CAPSULE | Freq: Every day | ORAL | Status: DC
  Administered 2016-05-20: 60 mg via ORAL
  Filled 2016-05-19 (×2): qty 1

## 2016-05-19 MED ORDER — FLUVOXAMINE MALEATE 100 MG PO TABS: 100.0000 mg | ORAL_TABLET | Freq: Every day | ORAL | Status: AC

## 2016-05-19 MED ORDER — PALIPERIDONE PALMITATE 234 MG/1.5ML IM SUSP: 234.0000 mg | Freq: Once | INTRAMUSCULAR | Status: AC

## 2016-05-19 MED ORDER — MEGESTROL ACETATE 400 MG/10ML PO SUSP: 400.0000 mg | Freq: Every day | ORAL | Status: AC

## 2016-05-19 MED ORDER — PROPRANOLOL HCL ER 60 MG PO CP24: 60.0000 mg | ORAL_CAPSULE | Freq: Every day | ORAL | Status: AC

## 2016-05-19 MED ORDER — CYANOCOBALAMIN 1000 MCG PO TABS: 1000.0000 ug | ORAL_TABLET | Freq: Every day | ORAL | Status: AC

## 2016-05-19 MED ORDER — LORAZEPAM 1 MG PO TABS: 1.0000 mg | ORAL_TABLET | Freq: Every evening | ORAL | Status: AC | PRN

## 2016-05-19 NOTE — Progress Notes (Signed)
Patient continues to be preoccupied. No negative behaviors noted. Pt possible discharge in am. Med and group compliant. Remains safe on unit with q 15 minc checks.

## 2016-05-19 NOTE — [facility] Suicide Risk Assessment (Signed)
[facility]   Principal Problem: Schizophrenia, paranoid, chronic (HCC) Discharge Diagnoses:  Patient Active Problem List   Diagnosis Date Noted  . Schizophrenia, paranoid, chronic (HCC) [F20.0] 05/13/2016  . 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 Tobacco use disorder [F17.200] 04/27/2016    Total Time spent with patient: 30 minutes  Musculoskeletal: Strength & Muscle Tone: within normal limits Gait & Station: normal Patient leans: N/A  Psychiatric Specialty Exam: Review of Systems  Psychiatric/Behavioral: Positive for hallucinations. The patient is nervous/anxious.   All other systems reviewed and are negative.   Blood pressure 95/64, pulse 94, temperature 98 F (36.7 C), temperature source Oral, resp. rate 16, height 5' 7.72" (1.72 m), weight 51.256 kg (113 lb), last menstrual period 04/27/2016.Body mass index is 17.33 kg/(m^2).  General Appearance: Fairly Groomed  Patent attorneyye Contact::  Good  Speech:  Clear and Coherent409  Volume:  Normal  Mood:  Anxious  Affect:  Appropriate  Thought Process:  Disorganized  Orientation:  Full (Time, Place, and Person)  Thought Content:  Delusions, Obsessions, Paranoid Ideation and Rumination  Suicidal Thoughts:  No  Homicidal Thoughts:  No  Memory:  Immediate;   Fair Recent;   Fair Remote;   Fair  Judgement:  Poor  Insight:  Lacking  Psychomotor Activity:  EPS and TD  Concentration:  Fair  Recall:  F[employer]Fair  Fund of Knowledge:Fair  Language: Fair  Akathisia:  Yes  Handed:  Right  AIMS (if indicated):     Assets:  Communication Skills Desire for Improvement Financial Resources/Insurance Housing Physical Health Resilience Social Support  Sleep:  Number of Hours: 6.5  Cognition: WNL  ADL's:  Intact   Mental Status Per Nursing Assessment::   On Admission:  NA  Demographic Factors:  NA  Loss Factors: NA  Historical Factors: Impulsivity  Risk Reduction Factors:   Sense  of responsibility to family, Living with another person, especially a relative and Positive social support  Continued Clinical Symptoms:  Obsessive-Compulsive Disorder Schizophrenia:   Paranoid or undifferentiated type  Cognitive Features That Contribute To Risk:  None    Suicide Risk:  Minimal: No identifiable suicidal ideation.  Patients presenting with no risk factors but with morbid ruminations; may be classified as minimal risk based on the severity of the depressive symptoms    Plan Of Care/Follow-up recommendations:  Activity:  as toerated. Diet:  low sodium heart healthy. Other:  keep follow up appointment.  Kristine LineaJolanta Arney Mayabb, MD 05/19/2016, 11:15 PM

## 2016-05-19 NOTE — Progress Notes (Signed)
Recreation Therapy Notes  Date: 06.21.17 Time: 9:30 am Location: Craft Room  Group Topic: Self-esteem  Goal Area(s) Addresses:  Patient will be able to identify at least one positive trait. Patient will verbalize benefit of having healthy self-esteem.  Behavioral Response: Left early  Intervention: I Am  Activity: Patients were given a worksheet with the letter I on it and instructed to write as many positive traits about themselves inside the letter.  Education: LRT educated patients on ways they can increase their self-esteem.  Education Outcome: Patient left before LRT educated group.  Clinical Observations/Feedback: Patient asked if she was being d/c today and when LRT told patient she did not know, patient got up and left group. Patient did not return to group.  Jacquelynn CreeGreene,Keiley Levey M, LRT/CTRS 05/19/2016 10:17 AM
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
## 2016-05-19 NOTE — Progress Notes (Signed)
[facility] MD Progress Note  05/19/2016 6:45 AM [name] [name]  MRN:  [id number]  Subjective:  [name] [name] was able to participate in treatment team today. She is still very delusional, paranoid and disorganized in her thinking but was able to have a discussion about discharge planning. She initially was that her mother is ready to take her back. In fact I found a note from the nurse that the mother requested that we call her last Monday. She talked to her mother again today. She has learned that the mother tried to get it yesterday but was unable to do so as she did not know the patient's code. She accepts medications and tolerates them well. I think that she's made much progress. Her home is much cleaner today. There are no somatic newspapers. She started posting notes on the window talking about her schizophrenia. There are no somatic complaints. There are no behavioral problems. She is very pleasant polite and appropriate even though rather confused.  Principal Problem: Schizophrenia, paranoid, chronic (HCC) Diagnosis:   Patient Active Problem List   Diagnosis Date Noted  . Schizophrenia, paranoid, chronic (HCC) [F20.0] 05/13/2016  . 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 Tobacco use disorder [F17.200] 04/27/2016   Total Time spent with patient: 20 minutes  Past Psychiatric History: Schizophrenia.  Past Medical History:  Past Medical History  Diagnosis Date  . Schizophrenia (HCC)     only known    History reviewed. No pertinent past surgical history. Family History: History reviewed. No pertinent family history. Family Psychiatric  History: See H&P. Social History:  History  Alcohol Use  . 0.0 oz/week  . 1-2 Cans of beer per week    Comment: unknown - 04/22/16     History  Drug Use Not on file    Social History   Social History  . Marital Status: Single    Spouse Name: N/A  . Number of Children: N/A  . Years of Education: N/A   Social History Main  Topics  . Smoking status: Current Every Day Smoker -- 1.00 packs/day    Types: Cigarettes  . Smokeless tobacco: None  . Alcohol Use: 0.0 oz/week    1-2 Cans of beer per week     Comment: unknown - 04/22/16  . Drug Use: None  . Sexual Activity: No   Other Topics Concern  . None   Social History Narrative   Additional Social History:    Pain Medications: See PTA Prescriptions: See PTA Over the Counter: See PTA History of alcohol / drug use?: No history of alcohol / drug abuse Negative Consequences of Use: Financial                    Sleep: Fair  Appetite:  Fair  Current Medications: Current Facility-Administered Medications  Medication Dose Route Frequency Provider Last Rate Last Dose  . acetaminophen (TYLENOL) tablet 650 mg  650 mg Oral Q6H PRN Brandy Hale, MD      . alum & mag hydroxide-simeth (MAALOX/MYLANTA) 200-200-20 MG/5ML suspension 30 mL  30 mL Oral Q4H PRN Brandy Hale, MD      . cyanocobalamin ((VITAMIN B-12)) injection 1,000 mcg  1,000 mcg Intramuscular Daily Shari Prows, MD   1,000 mcg at 05/18/16 0855  . feeding supplement (ENSURE ENLIVE) (ENSURE ENLIVE) liquid 237 mL  237 mL Oral TID BM Jimmy Footman, MD   237 mL at 05/18/16 2011  . fluvoxaMINE (LUVOX) tablet 100 mg  100 mg Oral QHS Shari ProwsJolanta B Quantarius Genrich, MD   100 mg at 05/18/16 2220  . LORazepam (ATIVAN) tablet 1 mg  1 mg Oral QHS Jimmy FootmanAndrea Hernandez-Gonzalez, MD   1 mg at 05/18/16 2220  . magnesium hydroxide (MILK OF MAGNESIA) suspension 30 mL  30 mL Oral Daily PRN Brandy HaleUzma Faheem, MD      . megestrol (MEGACE) 400 MG/10ML suspension 400 mg  400 mg Oral Daily Priscella Donna B Shonika Kolasinski, MD   400 mg at 05/18/16 0856  . multivitamin with minerals tablet 1 tablet  1 tablet Oral Daily Jimmy FootmanAndrea Hernandez-Gonzalez, MD   1 tablet at 05/18/16 0855  . nicotine (NICODERM CQ - dosed in mg/24 hours) patch 21 mg  21 mg Transdermal Daily Jimmy FootmanAndrea Hernandez-Gonzalez, MD   21 mg at 05/15/16 0857  . paliperidone  (INVEGA) 24 hr tablet 12 mg  12 mg Oral Daily Jimmy FootmanAndrea Hernandez-Gonzalez, MD   12 mg at 05/18/16 0855  . traZODone (DESYREL) tablet 100 mg  100 mg Oral QHS PRN Brandy HaleUzma Faheem, MD   100 mg at 05/15/16 2126  . [START ON 05/20/2016] vitamin B-12 (CYANOCOBALAMIN) tablet 1,000 mcg  1,000 mcg Oral Daily Karri Kallenbach B Guerin Lashomb, MD        Lab Results: No results found for this or any previous visit (from the past 48 hour(s)).  Blood Alcohol level:  Lab Results  Component Value Date   ETH <5 05/12/2016   ETH <5 04/22/2016    Physical Findings: AIMS: Facial and Oral Movements Muscles of Facial Expression: None, normal Lips and Perioral Area: None, normal Jaw: None, normal Tongue: None, normal,Extremity Movements Upper (arms, wrists, hands, fingers): None, normal Lower (legs, knees, ankles, toes): None, normal, Trunk Movements Neck, shoulders, hips: None, normal, Overall Severity Severity of abnormal movements (highest score from questions above): None, normal Incapacitation due to abnormal movements: None, normal Patient's awareness of abnormal movements (rate only patient's report): No Awareness, Dental Status Current problems with teeth and/or dentures?: Yes Does patient usually wear dentures?: No  CIWA:    COWS:     Musculoskeletal: Strength & Muscle Tone: within normal limits Gait & Station: normal Patient leans: N/A  Psychiatric Specialty Exam: Physical Exam  Nursing note and vitals reviewed.   Review of Systems  Psychiatric/Behavioral: Positive for hallucinations.  All other systems reviewed and are negative.   Blood pressure 103/66, pulse 93, temperature 97.9 F (36.6 C), temperature source Oral, resp. rate 16, height 5' 7.72" (1.72 m), weight 51.256 kg (113 lb), last menstrual period 04/27/2016.Body mass index is 17.33 kg/(m^2).  General Appearance: Fairly Groomed  Eye Contact:  Good  Speech:  Pressured  Volume:  Normal  Mood:  Anxious  Affect:  Appropriate  Thought  Process:  Disorganized  Orientation:  Full (Time, Place, and Person)  Thought Content:  Delusions and Paranoid Ideation  Suicidal Thoughts:  No  Homicidal Thoughts:  No  Memory:  Immediate;   Fair Recent;   Fair Remote;   Fair  Judgement:  Poor  Insight:  Lacking  Psychomotor Activity:  EPS and Increased  Concentration:  Concentration: Poor and Attention Span: Poor  Recall:  Poor  Fund of Knowledge:  Poor  Language:  Poor  Akathisia:  Yes  Handed:  Right  AIMS (if indicated):     Assets:  Communication Skills Desire for Improvement Financial Resources/Insurance Housing Physical Health Resilience Social Support  ADL's:  Intact  Cognition:  WNL  Sleep:  Number of Hours: 6.5     Treatment Plan Summary:  Daily contact with patient to assess and evaluate symptoms and progress in treatment and Medication management   Ms. Benninger is a 45 year old female with a history of schizophrenia admitted for worsening psychosis most likely in the context of treatment noncompliance.   1. Schizophrenia. We continued Invega 12 mg po qhs. He received invega sustenna 234 mg on 6/1 and 156 mg on 6/5. She will be due for Invega sustenna 234 mg on 6/30.  2. Insomnia. We continued Ativan.   3. EPS. I will add propranolol for akathisia.  4. Failure to thrive. Patient appears malnourish. We ordered ensure, multivitamins and Megace.  5. Smoking. Nicotine patch is available.   6. Metabolic syndrome monitoring. Hemoglobin A1c is 5.2, TSH is normal, prolactin elevated at 79.3.   7. B12 defficiency. Vitamin B12 is 261. We started Vit B12 supplementation.   8. Social. Referral to APS of [facility] recommending guardianship. We will need to contact them to see if they have follow-up with the patient.  9. Anxiety. I increased Luvox to 100 mg.  10. The patient will be discharged to home with her mother. She will [facility] with [facility].   Kristine Linea,  MD 05/19/2016, 6:45 AM

## 2016-05-19 NOTE — [facility] Group Notes (Signed)
[facility] Group Notes:  (Nursing/MHT/Case Management/Adjunct)  Date:  05/19/2016  Time:  6:59 PM  Type of Therapy:  Psychoeducational Skills  Participation Level:  None  Participation Quality:  None  Affect:  Flat  Cognitive:  Lacking  Insight:  Lacking  Engagement in Group:  None  Modes of Intervention:  Discussion and Education  Summary of Progress/Problems: patient stayed for part of group   Twanna Hymanda C Konica Stankowski 05/19/2016, 6:59 PM

## 2016-05-19 NOTE — [facility] Group Notes (Signed)
[facility] LCSW Group Therapy  05/19/2016 2:29 PM  Type of Therapy:  Group Therapy  Participation Level:  Did Not Attend  Modes of Intervention:  Discussion, Education, Socialization and Support  Summary of Progress/Problems: Emotional Regulation: Patients will identify both negative and positive emotions. They will discuss emotions they have difficulty regulating and how they impact their lives. Patients will be asked to identify healthy coping skills to combat unhealthy reactions to negative emotions.     Cornellius Kropp L Anokhi Shannon MSW, LCSWA  05/19/2016, 2:29 PM   

## 2016-05-20 NOTE — Plan of Care (Signed)
Problem: Education: Goal: Knowledge of the prescribed therapeutic regimen will improve Outcome: Not Progressing Pt has very limited knowledge of current prescription regimen, and can't adequately verbalize what medications are for.

## 2016-05-20 NOTE — Progress Notes (Signed)
Recreation Therapy Notes  Date: 06.22.17 Time: 9:30 am Location: Craft Room  Group Topic: Leisure Education  Goal Area(s) Addresses:  Patient will identify things they are grateful for. Patient will be educated on why it is important to be grateful.  Behavioral Response: Inattentive, Left early  Intervention: Grateful Wheel  Activity: Patients were given an I Am Grateful For worksheet and instructed to write things they were grateful for under each category.  Education: LRT educated patients on why it is important to be grateful.  Education Outcome: Patient left before LRT educated group.   Clinical Observations/Feedback: Patient wrote on her worksheet. Patient left group at approximately 10:08 am. Patient did not return to group.  Jacquelynn CreeGreene,Benjamine Strout M, LRT/CTRS 05/20/2016 10:31 AM

## 2016-05-20 NOTE — Progress Notes (Signed)
Pleasant and cooperative.  Oriented to person.  Continues to have some delusional thinking.  Denies SI/HI/AVH. Discharge instructions reviewed with patient and her mother. Each verbalized understanding.  Personal belongings returned.  Prescriptions given.  Care relinquished to the mother.

## 2016-05-20 NOTE — Progress Notes (Signed)
  [facility] :  Will you be returning to the same living situation after discharge:  Yes,  pt will [place] her [place] At discharge, do you have transportation home?: Yes,  pt will be picked up by her mother Do you have the ability to pay for your medications: Yes,  pt will be provided with prescriptins at discharge  Release of information consent forms completed and in the chart;  Patient's signature needed at discharge.  Patient to Follow up at: Follow-up Information    Follow up with [facility].   Why:  Your ACT Team wll arrive at your home between 1pm-5pm on Friday June 23rd, 2017 for your hospital follow up for mediication management and therapy.  If you experience a crisis please call the crisis line at ph: [phone]   Contact information:   [facility] [address] [place] [address] Ph: [phone] Crisis Line: [phone] Fax: [phone]      Next level of care provider has [facility] to [facility] Link:no  Safety Planning and Suicide Prevention discussed: No, pt refused  Have you used any form of tobacco in the last 30 days? (Cigarettes, Smokeless Tobacco, Cigars, and/or Pipes): Yes  Has patient been referred to the Quitline?: Patient refused referral  Patient has been referred for addiction treatment: N/A  Dorothe PeaJonathan F Sakari Alkhatib 05/20/2016, 2:52 PM

## 2016-05-20 NOTE — [facility] Suicide Risk Assessment (Signed)
[facility] INPATIENT:  Family/Significant Other Suicide Prevention Education  Suicide Prevention Education:  Patient Refusal for Family/Significant Other Suicide Prevention Education: The patient [name] [name] has refused to provide written consent for family/significant other to be provided Family/Significant Other Suicide Prevention Education during admission and/or prior to discharge.  Physician notified. Pt refused SPE with from the CSW.  Dorothe PeaJonathan F Brityn Mastrogiovanni 05/20/2016, 2:41 PM

## 2016-05-20 NOTE — Tx Team (Signed)
Interdisciplinary Treatment Plan Update (Adult)        Date: 05/20/2016 NEW ADMISSION (Pt was previously discharged on June 9th, 2017)  Time Reviewed: 9:30 AM   Progress in Treatment: Improving  Attending groups: No Participating in groups: No Taking medication as prescribed: Yes  Tolerating medication: Yes  Family/Significant other contact made: Yes, CSW spoke to the pt's mother [name] [name] Patient understands diagnosis: Yes  Discussing patient identified problems/goals with staff: Yes  Medical problems stabilized or resolved: Yes  Denies suicidal/homicidal ideation: Yes  Issues/concerns per patient self-inventory: Yes  Other:   New problem(s) identified: N/A   Discharge Plan or Barriers: Pt will discharge home to [facility] to live with her mother and will follow up with [facility] for medication management and therapy.  Reason for Continuation of Hospitalization:   Depression   Anxiety   Medication Stabilization   Comments: N/A   Estimated length of stay: 5-7 days    Patient is a 45 year old female admitted after presenting to the [facility] ED after being transported by the [facility]. Patient lives in [place]. Per previous admission H&P:  Pt is a45 y.o. female presenting to [facility] voluntarily via EMS with her mother who reports "she isn't feeling good and she is sick" "her teeth is rotting out of her head, she needs medication to realize that she needs healthcare." Patients mother reports that the patient was previously prescribed Haldol and Cogentin and she feels that the patient should be forced to be placed back on her medications "so that she can realize that I need o take care of her, she won't go to the doctor or take her medications." Patients mother denies that the patient has reported SI or HI but reports "she argues with the voices in her head and she laughs at them sometimes." Patients mother reports that the patient has not taken her  medication since 2014 and has a history of not taking her medications unless she is in the hospital. Patients mother reports that the patient has been complaining of stomach pain and she called EMS because she refuses to go to a doctor. Per chart review patient endorsed SI to EMS.   05/14/16: CSW at last admission filed a report, which was accepted, with APS.  CSW recommended to APS that guardianship be pursued with DSS as pt's legal guardian.  CSW also referred pt to [facility].  Pt is a client of [facility] presently.  Shortly, thereafter [facility] visited the pt and Sheriff's Dept was said to appear shortly thereafter.  Details are unclear at this time.  Per previous notes: Patient was assessed alone and her speech was very limited and circumstantial. Patient repeated "it's the government, I'm homeless, the government said I was homeless and that I need to see a sheriff." Patient then talks about the stock market and then repeats "it's the government." Patient is sitting upright in the main ED and rocks back and forth and points her right index finger into her left hand while staring at her index finger. Patient does not make eye contact. Patient denies SI and states that she did not tell EMS that she was suicidal. Patient denies HI. Patient did not answer the remaining questions of the assessment and continued to repeat statements about "the government" and "the stock market." Patients mother was invited back into the room and confirms that the patient has not been suicidal or homicidal to her knowledge. Patients mother's main  concern is that patient is unable to care for herself at this time. Patients mother states that she is not the patients guardian.  Patient will benefit from crisis stabilization, medication evaluation, group therapy, and psycho education in addition to case management for discharge planning. Patient and CSW reviewed pt's identified goals and treatment plan.  Pt verbalized understanding and agreed to treatment plan.    Review of initial/current patient goals per problem list:  1. Goal(s): Patient will participate in aftercare plan   Met: Yes  Target date: 3-5 days post admission date   As evidenced by: Patient will participate within aftercare plan AEB aftercare provider and housing plan at discharge being identified.   6/16: Pt will discharge home to [facility] to live with her mother and will follow up with [facility] for medication management and therapy.    2. Goal (s): Patient will exhibit decreased depressive symptoms and suicidal ideations.   Met: Adequate for discharge per MD.  Target date: 3-5 days post admission date   As evidenced by: Patient will utilize self-rating of depression at 3 or below and demonstrate decreased signs of depression or be deemed stable for discharge by MD.   6/16: Goal progressing. Pt denies SI/HI.  Pt reports she is safe for discharge.    3. Goal(s): Patient will demonstrate decreased signs and symptoms of anxiety.   Met: Adequate for discharge per MD.  Target date: 3-5 days post admission date   As evidenced by: Patient will utilize self-rating of anxiety at 3 or below and demonstrated decreased signs of anxiety, or be deemed stable for discharge by MD   6/16: Goal progressing  6/22: Adequate for discharge per MD.  Pt reports baseline symptoms of anxiety    4. Goal(s): Patient will demonstrate decreased signs of psychosis  * Met: Adequate for discharge per MD. * Target date: 3-5 days post admission date  * As evidenced by: Patient will demonstrate decreased frequency of AVH or return to baseline function   6/16: Goal progressing.  6/22: Adequate for discharge per MD.  Pt denies AVH.   Attendees:  Patient:  Family:  Physician: Dr. Bary Leriche, MD    05/20/2016 9:30 AM  Nursing: Carolynn Sayers, RN    05/20/2016 9:30 AM  Clinical Social Worker: Marylou Flesher, [place]   05/20/2016 9:30 AM  Recreational Therapist: Everitt Amber   05/20/2016 9:30 AM  Other:        05/20/2016 9:30 AM  Other:        05/20/2016 9:30 AM  Other:        05/20/2016 9:30 AM   Alphonse Guild. Kairon Shock, LCSWA, LCAS  05/20/16

## 2016-05-20 NOTE — Progress Notes (Signed)
D: Observed pt in room reading newspaper.Patient alert and oriented to self. Pt disoriented to place, time and situation. Patient denies SI/HI/AVH. Pt affect is very flat. Pt couldn't adequately verbalize medications or reasons for medications. Pt forwards little and interacts minimally giving mostly "yes/no" answers. When asked pt reason for admission she stated "here for prescription." Pt denied feeling anxious or depressed. Pt had no complaints. Pt room was full of newspapers and pt stated "i like to read and look at the pictures." Pt has not insight into condition.   A: Offered active listening and support. Provided therapeutic communication. Administered scheduled medications. Gave trazodone prn for sleep. R: Pt pleasant and cooperative. Pt medication compliant. Will continue Q15 min. checks. Safety maintained.

## 2016-12-29 ENCOUNTER — Other Ambulatory Visit: Payer: Self-pay | Admitting: Internal Medicine

## 2016-12-29 DIAGNOSIS — Z1231 Encounter for screening mammogram for malignant neoplasm of breast: Secondary | ICD-10-CM

## 2017-01-19 ENCOUNTER — Ambulatory Visit
Admission: RE | Admit: 2017-01-19 | Discharge: 2017-01-19 | Disposition: A | Discharge status: Home / Self Care | Payer: Medicaid Other | Source: Ambulatory Visit | Attending: Internal Medicine | Admitting: Internal Medicine

## 2017-01-19 DIAGNOSIS — Z1231 Encounter for screening mammogram for malignant neoplasm of breast: Secondary | ICD-10-CM

## 2017-02-11 ENCOUNTER — Ambulatory Visit (INDEPENDENT_AMBULATORY_CARE_PROVIDER_SITE_OTHER): Payer: Medicaid Other | Admitting: Obstetrics & Gynecology

## 2017-02-11 ENCOUNTER — Encounter: Payer: Self-pay | Admitting: Obstetrics & Gynecology

## 2017-02-11 ENCOUNTER — Other Ambulatory Visit (HOSPITAL_COMMUNITY)
Admission: RE | Admit: 2017-02-11 | Discharge: 2017-02-11 | Disposition: A | Discharge status: Home / Self Care | Payer: Medicaid Other | Source: Ambulatory Visit | Attending: Obstetrics & Gynecology | Admitting: Obstetrics & Gynecology

## 2017-02-11 VITALS — BP 96/69 | HR 84 | Ht 67.5 in | Wt 121.3 lb

## 2017-02-11 DIAGNOSIS — Z0000 Encounter for general adult medical examination without abnormal findings: Secondary | ICD-10-CM | POA: Diagnosis present

## 2017-02-11 DIAGNOSIS — Z01419 Encounter for gynecological examination (general) (routine) without abnormal findings: Secondary | ICD-10-CM | POA: Insufficient documentation

## 2017-02-11 DIAGNOSIS — Z1151 Encounter for screening for human papillomavirus (HPV): Secondary | ICD-10-CM | POA: Insufficient documentation

## 2017-02-11 NOTE — Progress Notes (Signed)
Subjective:     [name] RossettiWendy [name] is a 46 y.o. female here for a routine exam. G1P1 Current complaints: none.  Pt has not been sexually active for 25 years.    Gynecologic History No LMP recorded. Patient has had an injection. Contraception: abstinence [place]  WNL per pt Last mammogram: 01/19/2017. Results were: normal  Obstetric History OB History  Gravida Para Term Preterm AB Living  1 1 1         SAB TAB Ectopic Multiple Live Births          1    # Outcome Date GA Lbr Len/2nd Weight Sex Delivery Anes PTL Lv  1 Term              The following portions of the patient's history were reviewed and updated as appropriate: allergies, current medications, past family history, past medical history, past social history, past surgical history and problem list.  Review of Systems Pertinent items are noted in HPI.    Objective:  BP 96/69   Pulse 84   Ht 5' 7.5" (1.715 m)   Wt 121 lb 4.8 oz (55 kg)   BMI 18.72 kg/m  General Appearance:    Alert, cooperative, no distress, appears stated age; pt is unkept and hair is matted. She is pleasant  Head:    Normocephalic, without obvious abnormality, atraumatic; poor dentition  Eyes:    conjunctiva/corneas clear, EOM's intact, both eyes  Ears:    Normal external ear canals, both ears  Nose:   Nares normal, septum midline, mucosa normal, no drainage    or sinus tenderness  Throat:   Lips, mucosa, and tongue normal; teeth and gums normal  Neck:   Supple, symmetrical, trachea midline, no adenopathy;    thyroid:  no enlargement/tenderness/nodules  Back:     Symmetric, no curvature, ROM normal, no CVA tenderness  Lungs:     Clear to auscultation bilaterally, respirations unlabored  Chest Wall:    No tenderness or deformity   Heart:    Regular rate and rhythm, S1 and S2 normal, no murmur, rub   or gallop  Breast Exam:    No tenderness, masses, or nipple abnormality  Abdomen:     Soft, non-tender, bowel sounds active all  four quadrants,    no masses, no organomegaly  Genitalia:    Normal female without lesion, discharge or tenderness     Extremities:   Extremities normal, atraumatic, no cyanosis or edema  Pulses:   2+ and symmetric all extremities  Skin:   Skin color, texture, turgor normal, no rashes or lesions     Assessment:    Healthy female exam.    Plan:    Follow up in: 1 year.  f/u PAP  [name] [name] L. Harraway-Smith, M.D., Evern CoreFACOG

## 2017-02-15 LAB — CYTOLOGY - PAP
Diagnosis: NEGATIVE
HPV: NOT DETECTED

## 2019-08-09 ENCOUNTER — Other Ambulatory Visit: Payer: Self-pay | Admitting: Internal Medicine

## 2019-08-09 DIAGNOSIS — Z1231 Encounter for screening mammogram for malignant neoplasm of breast: Secondary | ICD-10-CM

## 2019-09-25 ENCOUNTER — Ambulatory Visit
Admission: RE | Admit: 2019-09-25 | Discharge: 2019-09-25 | Disposition: A | Discharge status: Home / Self Care | Payer: Medicaid Other | Source: Ambulatory Visit | Attending: Internal Medicine | Admitting: Internal Medicine

## 2019-09-25 ENCOUNTER — Other Ambulatory Visit: Payer: Self-pay

## 2019-09-25 DIAGNOSIS — Z1231 Encounter for screening mammogram for malignant neoplasm of breast: Secondary | ICD-10-CM

## 2019-09-27 ENCOUNTER — Other Ambulatory Visit: Payer: Self-pay | Admitting: Internal Medicine

## 2019-09-27 DIAGNOSIS — R928 Other abnormal and inconclusive findings on diagnostic imaging of breast: Secondary | ICD-10-CM

## 2021-02-24 ENCOUNTER — Other Ambulatory Visit: Payer: Self-pay | Admitting: Internal Medicine

## 2021-02-24 DIAGNOSIS — R928 Other abnormal and inconclusive findings on diagnostic imaging of breast: Secondary | ICD-10-CM

## 2021-02-25 LAB — HEPATIC FUNCTION PANEL
AG Ratio: 1.5 (calc) (ref 1.0–2.5)
ALT: 8 U/L (ref 6–29)
AST: 11 U/L (ref 10–35)
Albumin: 4.1 g/dL (ref 3.6–5.1)
Alkaline phosphatase (APISO): 68 U/L (ref 37–153)
Bilirubin, Direct: 0.1 mg/dL (ref 0.0–0.2)
Globulin: 2.8 g/dL (calc) (ref 1.9–3.7)
Indirect Bilirubin: 0.5 mg/dL (calc) (ref 0.2–1.2)
Total Bilirubin: 0.6 mg/dL (ref 0.2–1.2)
Total Protein: 6.9 g/dL (ref 6.1–8.1)

## 2021-02-25 LAB — EXTRA LAV TOP TUBE

## 2021-02-25 LAB — LIPID PANEL
Cholesterol: 252 mg/dL — ABNORMAL HIGH (ref <200)
HDL: 62 mg/dL (ref >=50)
LDL Cholesterol (Calc): 158 mg/dL (calc) — ABNORMAL HIGH
Non-HDL Cholesterol (Calc): 190 mg/dL (calc) — ABNORMAL HIGH (ref <130)
Total CHOL/HDL Ratio: 4.1 (calc) (ref <5.0)
Triglycerides: 170 mg/dL — ABNORMAL HIGH (ref <150)

## 2021-03-16 ENCOUNTER — Ambulatory Visit: Payer: Medicaid Other

## 2021-03-16 ENCOUNTER — Ambulatory Visit
Admission: RE | Admit: 2021-03-16 | Discharge: 2021-03-16 | Disposition: A | Discharge status: Home / Self Care | Payer: Medicaid Other | Source: Ambulatory Visit | Attending: Internal Medicine | Admitting: Internal Medicine

## 2021-03-16 ENCOUNTER — Other Ambulatory Visit: Payer: Self-pay

## 2021-03-16 DIAGNOSIS — R928 Other abnormal and inconclusive findings on diagnostic imaging of breast: Secondary | ICD-10-CM

## 2021-09-01 ENCOUNTER — Other Ambulatory Visit: Payer: Self-pay | Admitting: Internal Medicine

## 2021-09-02 LAB — CBC
HCT: 46.1 % — ABNORMAL HIGH (ref 35.0–45.0)
Hemoglobin: 15.8 g/dL — ABNORMAL HIGH (ref 11.7–15.5)
MCH: 30.5 pg (ref 27.0–33.0)
MCHC: 34.3 g/dL (ref 32.0–36.0)
MCV: 89 fL (ref 80.0–100.0)
MPV: 10.3 fL (ref 7.5–12.5)
Platelets: 215 10*3/uL (ref 140–400)
RBC: 5.18 10*6/uL — ABNORMAL HIGH (ref 3.80–5.10)
RDW: 11.7 % (ref 11.0–15.0)
WBC: 6.2 10*3/uL (ref 3.8–10.8)

## 2021-09-02 LAB — LIPID PANEL
Cholesterol: 257 mg/dL — ABNORMAL HIGH (ref <200)
HDL: 69 mg/dL (ref >=50)
LDL Cholesterol (Calc): 166 mg/dL (calc) — ABNORMAL HIGH
Non-HDL Cholesterol (Calc): 188 mg/dL (calc) — ABNORMAL HIGH (ref <130)
Total CHOL/HDL Ratio: 3.7 (calc) (ref <5.0)
Triglycerides: 108 mg/dL (ref <150)

## 2021-09-02 LAB — COMPLETE METABOLIC PANEL WITH GFR
AG Ratio: 1.7 (calc) (ref 1.0–2.5)
ALT: 8 U/L (ref 6–29)
AST: 13 U/L (ref 10–35)
Albumin: 4.4 g/dL (ref 3.6–5.1)
Alkaline phosphatase (APISO): 60 U/L (ref 37–153)
BUN: 11 mg/dL (ref 7–25)
CO2: 19 mmol/L — ABNORMAL LOW (ref 20–32)
Calcium: 9.7 mg/dL (ref 8.6–10.4)
Chloride: 103 mmol/L (ref 98–110)
Creat: 0.88 mg/dL (ref 0.50–1.03)
Globulin: 2.6 g/dL (calc) (ref 1.9–3.7)
Glucose, Bld: 119 mg/dL — ABNORMAL HIGH (ref 65–99)
Potassium: 4.1 mmol/L (ref 3.5–5.3)
Sodium: 136 mmol/L (ref 135–146)
Total Bilirubin: 0.6 mg/dL (ref 0.2–1.2)
Total Protein: 7 g/dL (ref 6.1–8.1)
eGFR: 79 mL/min/{1.73_m2} (ref >=60)

## 2021-09-02 LAB — TSH: TSH: 1 mIU/L

## 2021-09-02 LAB — VITAMIN D 25 HYDROXY (VIT D DEFICIENCY, FRACTURES): Vit D, 25-Hydroxy: 19 ng/mL — ABNORMAL LOW (ref 30–100)

## 2022-02-04 IMAGING — MG DIGITAL DIAGNOSTIC BILAT W/ TOMO W/ CAD
6 of 10 series · 6 of 30 positions shown · non-contrast
Comparison: Previous exam(s).

CLINICAL DATA: The patient was called back for a right breast
asymmetry in Friday August, 2019.

EXAM:
DIGITAL DIAGNOSTIC BILATERAL MAMMOGRAM WITH TOMOSYNTHESIS AND CAD
TECHNIQUE: Bilateral digital diagnostic mammography and breast tomosynthesis
was performed. The images were evaluated with computer-aided
detection.

[R CC synth-2D]
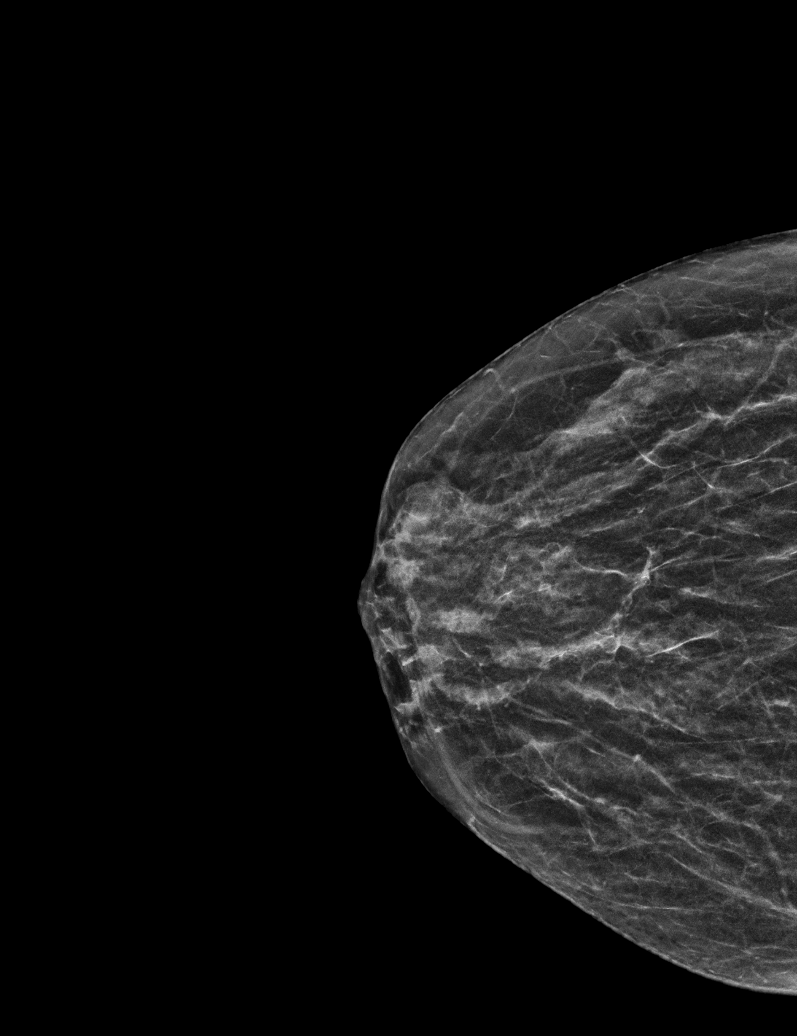

[L CC synth-2D]
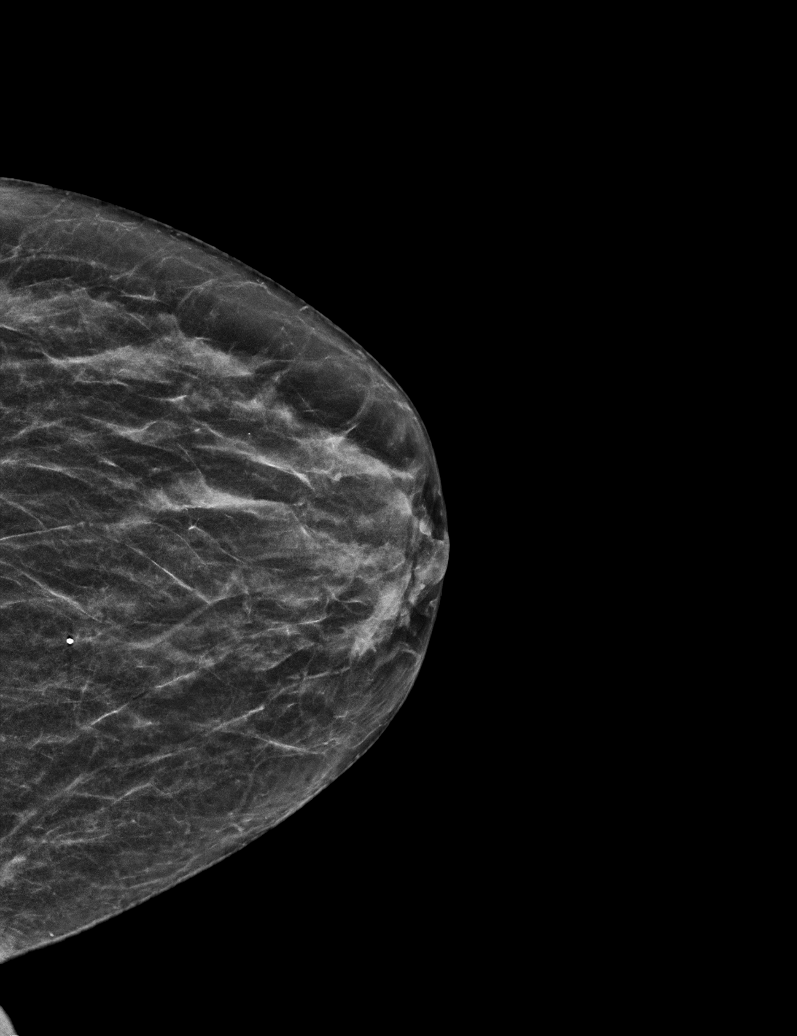

[L MLO synth-2D]
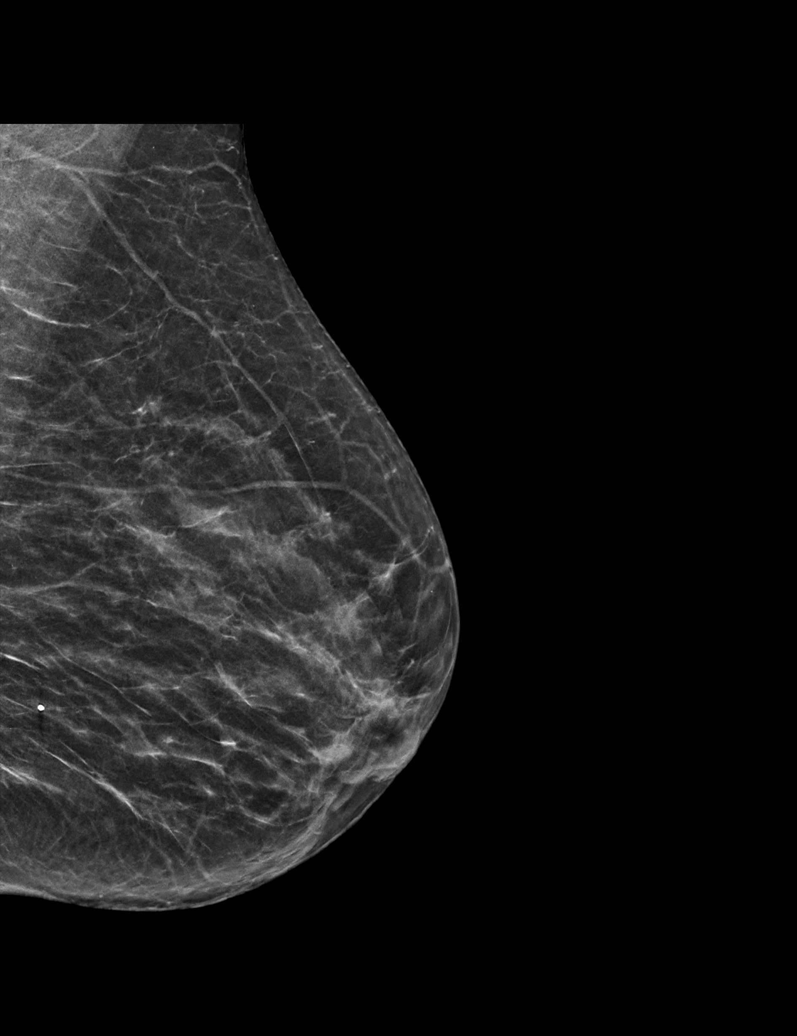

[R MLO synth-2D (1 of 2)]
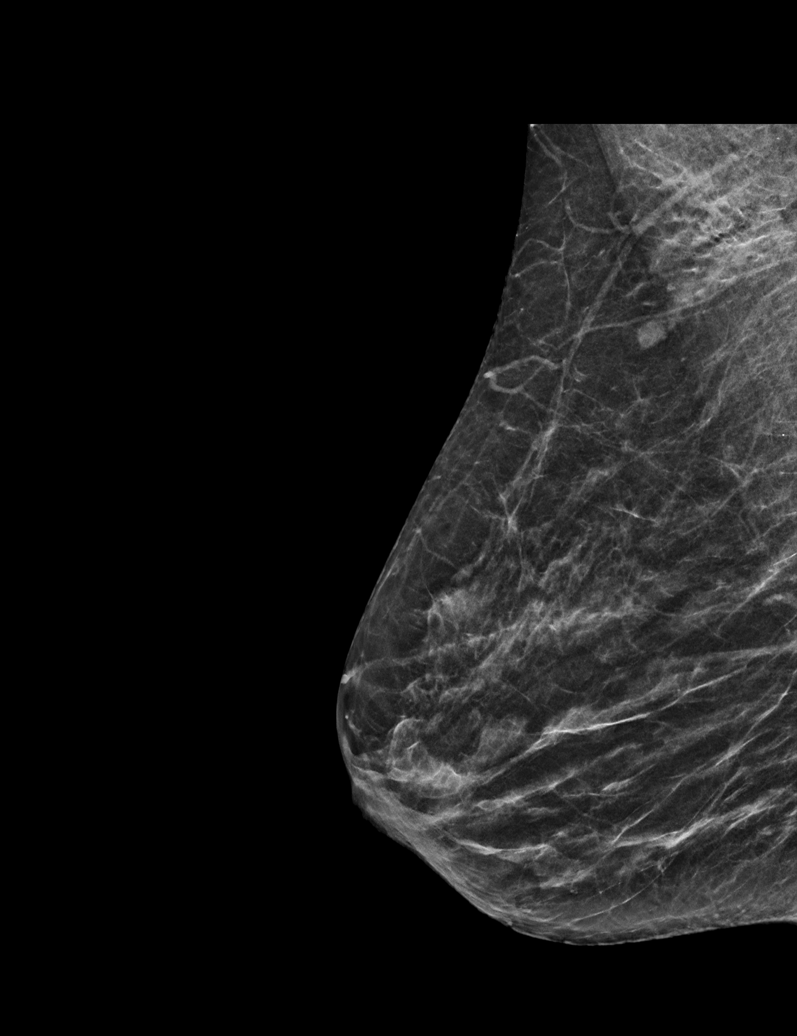

[R MLO synth-2D (2 of 2)]
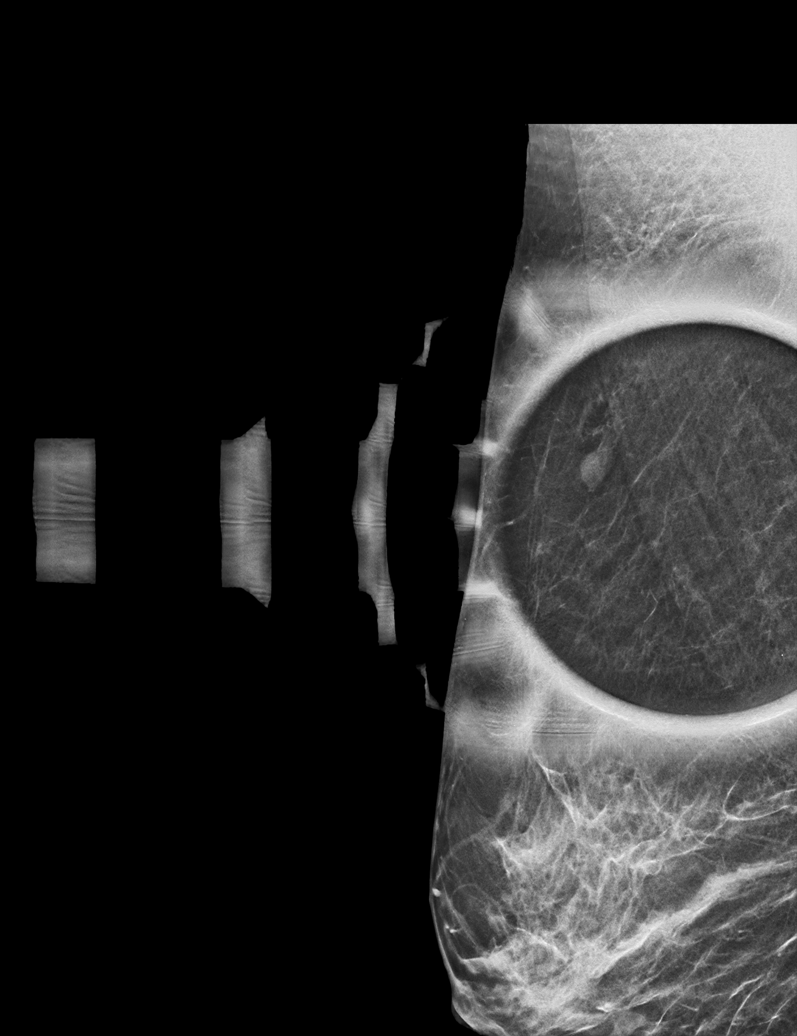

[L CC tomo · tomo slice 21/42.0]
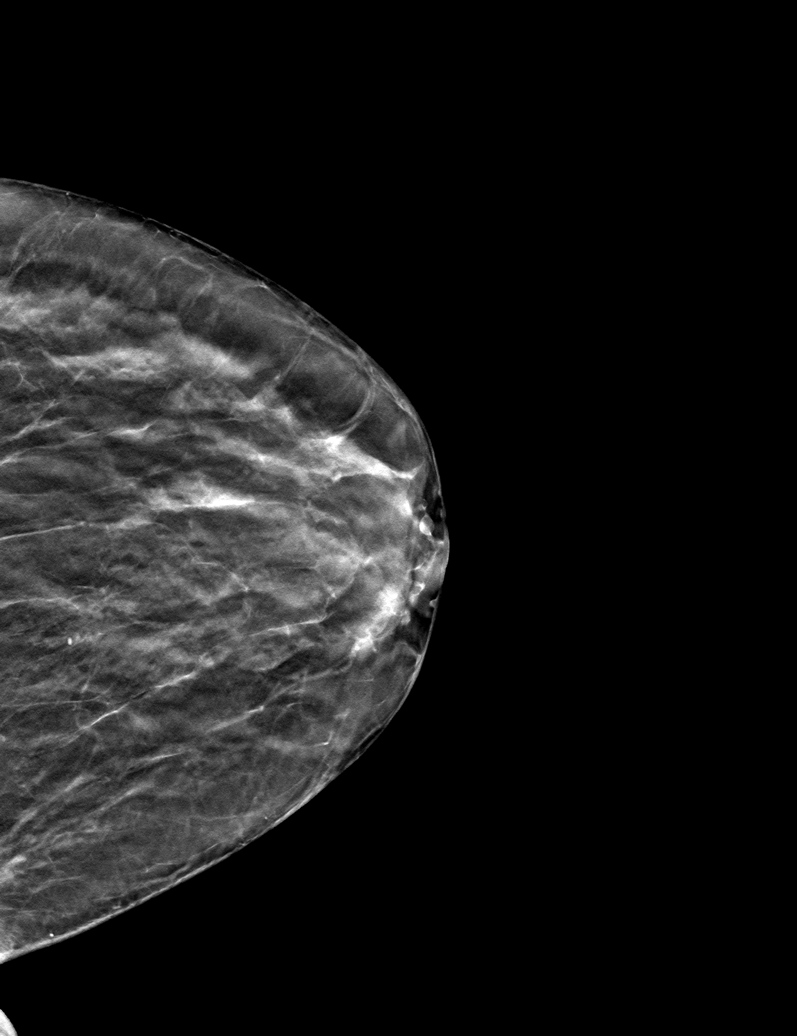

[6 of 30 positions shown; findings below may reference images not displayed]

ACR Breast Density Category c: The breast tissue is heterogeneously
dense, which may obscure small masses.
FINDINGS: The right breast asymmetry has been stable since December 2016, of
no significance. No other suspicious findings are identified in
either breast.
IMPRESSION: No mammographic evidence of malignancy.

RECOMMENDATION:
Annual screening mammography.

I have discussed the findings and recommendations with the patient.
If applicable, a reminder letter will be sent to the patient
regarding the next appointment.

BI-RADS CATEGORY  2: Benign.

## 2022-03-02 ENCOUNTER — Other Ambulatory Visit: Payer: Self-pay | Admitting: Internal Medicine

## 2022-03-03 LAB — HEPATIC FUNCTION PANEL
AG Ratio: 1.4 (calc) (ref 1.0–2.5)
ALT: 10 U/L (ref 6–29)
AST: 14 U/L (ref 10–35)
Albumin: 4 g/dL (ref 3.6–5.1)
Alkaline phosphatase (APISO): 70 U/L (ref 37–153)
Bilirubin, Direct: 0.1 mg/dL (ref 0.0–0.2)
Globulin: 2.8 g/dL (calc) (ref 1.9–3.7)
Indirect Bilirubin: 0.4 mg/dL (calc) (ref 0.2–1.2)
Total Bilirubin: 0.5 mg/dL (ref 0.2–1.2)
Total Protein: 6.8 g/dL (ref 6.1–8.1)

## 2022-03-03 LAB — LIPID PANEL
Cholesterol: 253 mg/dL — ABNORMAL HIGH (ref <200)
HDL: 64 mg/dL (ref >=50)
LDL Cholesterol (Calc): 159 mg/dL (calc) — ABNORMAL HIGH
Non-HDL Cholesterol (Calc): 189 mg/dL (calc) — ABNORMAL HIGH (ref <130)
Total CHOL/HDL Ratio: 4 (calc) (ref <5.0)
Triglycerides: 167 mg/dL — ABNORMAL HIGH (ref <150)

## 2022-03-03 LAB — EXTRA LAV TOP TUBE
